# Patient Record
Sex: Female | Born: 1995 | Race: Black or African American | Hispanic: No | Marital: Single
Health system: Southern US, Community
[De-identification: ages and names within clinical notes are randomized; demographics above are authoritative.]

---
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
## 2017-12-18 ENCOUNTER — Emergency Department (HOSPITAL_COMMUNITY): Admission: EM | Admit: 2017-12-18 | Discharge: 2017-12-18 | Discharge status: Against Medical Advice | Payer: Self-pay

## 2017-12-18 NOTE — ED Notes (Signed)
Pt called for triage no answer  °

## 2018-03-27 ENCOUNTER — Encounter (HOSPITAL_COMMUNITY): Payer: Self-pay | Admitting: *Deleted

## 2018-03-27 ENCOUNTER — Emergency Department (HOSPITAL_COMMUNITY): Payer: Self-pay

## 2018-03-27 ENCOUNTER — Other Ambulatory Visit: Payer: Self-pay

## 2018-03-27 ENCOUNTER — Emergency Department (HOSPITAL_COMMUNITY)
Admission: EM | Admit: 2018-03-27 | Discharge: 2018-03-27 | Disposition: A | Discharge status: Home / Self Care | Payer: Self-pay | Attending: Emergency Medicine | Admitting: Emergency Medicine

## 2018-03-27 DIAGNOSIS — Z79899 Other long term (current) drug therapy: Secondary | ICD-10-CM | POA: Insufficient documentation

## 2018-03-27 DIAGNOSIS — Z7722 Contact with and (suspected) exposure to environmental tobacco smoke (acute) (chronic): Secondary | ICD-10-CM | POA: Insufficient documentation

## 2018-03-27 DIAGNOSIS — J45909 Unspecified asthma, uncomplicated: Secondary | ICD-10-CM | POA: Insufficient documentation

## 2018-03-27 DIAGNOSIS — J4540 Moderate persistent asthma, uncomplicated: Secondary | ICD-10-CM | POA: Insufficient documentation

## 2018-03-27 DIAGNOSIS — J069 Acute upper respiratory infection, unspecified: Secondary | ICD-10-CM | POA: Insufficient documentation

## 2018-03-27 MED ORDER — ALBUTEROL SULFATE HFA 108 (90 BASE) MCG/ACT IN AERS: 2.0000 | INHALATION_SPRAY | RESPIRATORY_TRACT | Status: DC

## 2018-03-27 MED ORDER — IPRATROPIUM-ALBUTEROL 0.5-2.5 (3) MG/3ML IN SOLN
3.0000 mL | Freq: Once | RESPIRATORY_TRACT | Status: AC
  Administered 2018-03-27: 3 mL via RESPIRATORY_TRACT
  Filled 2018-03-27: qty 3

## 2018-03-27 MED ORDER — PREDNISONE 20 MG PO TABS: 60.0000 mg | ORAL_TABLET | Freq: Once | ORAL | Status: DC

## 2018-03-27 MED ORDER — ACETAMINOPHEN 325 MG PO TABS
650.0000 mg | ORAL_TABLET | Freq: Four times a day (QID) | ORAL | Status: DC | PRN
  Administered 2018-03-27: 650 mg via ORAL
  Filled 2018-03-27: qty 2

## 2018-03-27 MED ORDER — ALBUTEROL SULFATE HFA 108 (90 BASE) MCG/ACT IN AERS: 2.0000 | INHALATION_SPRAY | RESPIRATORY_TRACT | 0 refills | Status: DC | PRN

## 2018-03-27 MED ORDER — ONDANSETRON 4 MG PO TBDP
4.0000 mg | ORAL_TABLET | Freq: Once | ORAL | Status: AC
  Administered 2018-03-27: 4 mg via ORAL
  Filled 2018-03-27: qty 1

## 2018-03-27 MED ORDER — PREDNISONE 10 MG PO TABS: ORAL_TABLET | ORAL | 0 refills | Status: DC

## 2018-03-27 NOTE — Discharge Instructions (Signed)
Return if any problems.

## 2018-03-27 NOTE — ED Provider Notes (Signed)
[facility] EMERGENCY DEPARTMENT Provider Note   CSN: [id number] Arrival date & time: 03/27/18  0210     History   Chief Complaint Chief Complaint  Patient presents with  . Generalized Body Aches    HPI Hannah Meyer is a 22 y.o. female.  The history is provided by the patient. No language interpreter was used.  Cough  This is a new problem. The problem occurs constantly. The problem has been gradually worsening. The cough is productive of sputum. There has been no fever. Associated symptoms include shortness of breath and wheezing. Pertinent negatives include no chest pain. She has tried nothing for the symptoms. The treatment provided no relief. She is not a smoker. Her past medical history is significant for asthma. Her past medical history does not include pneumonia.    Past Medical History:  Diagnosis Date  . Asthma     There are no active problems to display for this patient.   History reviewed. No pertinent surgical history.   OB History    Gravida  0   Para  0   Term  0   Preterm  0   AB  0   Living  0     SAB  0   TAB  0   Ectopic  0   Multiple  0   Live Births               Home Medications    Prior to Admission medications   Medication Sig Start Date End Date Taking? Authorizing Provider  acetaminophen (TYLENOL) 325 MG tablet Take 2 tablets (650 mg total) by mouth every 6 (six) hours as needed for fever. 09/21/15   Renne Crigler, PA-C  predniSONE (DELTASONE) 20 MG tablet Take 3 tablets (60 mg total) by mouth daily. 06/02/16   Niel Hummer, MD  predniSONE (DELTASONE) 20 MG tablet 3 tabs po daily x 4 days 06/02/16   Niel Hummer, MD    Family History No family history on file.  Social History Social History   Tobacco Use  . Smoking status: Passive Smoke Exposure - Never Smoker  . Smokeless tobacco: Never Used  Substance Use Topics  . Alcohol use: No  . Drug use: No     Allergies   Patient has no  known allergies.   Review of Systems Review of Systems  Respiratory: Positive for cough, shortness of breath and wheezing.   Cardiovascular: Negative for chest pain.  All other systems reviewed and are negative.    Physical Exam Updated Vital Signs BP 131/80   Pulse 66   Temp 98 F (36.7 C)   Resp 18   Ht 5\' 5"  (1.651 m)   Wt 99.8 kg (220 lb)   LMP 03/27/2018   SpO2 97%   BMI 36.61 kg/m   Physical Exam  Constitutional: She is oriented to person, place, and time. She appears well-developed and well-nourished.  HENT:  Head: Normocephalic.  Mouth/Throat: Oropharynx is clear and moist.  Eyes: EOM are normal.  Neck: Normal range of motion.  Cardiovascular: Normal rate and regular rhythm.  Pulmonary/Chest: Effort normal. She has wheezes.  Abdominal: She exhibits no distension.  Musculoskeletal: Normal range of motion.  Neurological: She is alert and oriented to person, place, and time.  Psychiatric: She has a normal mood and affect.  Nursing note and vitals reviewed.    ED Treatments / Results  Labs (all labs ordered are listed, but only abnormal results are displayed) Labs Reviewed -  No data to display  EKG None  Radiology No results found.  Procedures Procedures (including critical care time)  Medications Ordered in ED Medications  acetaminophen (TYLENOL) tablet 650 mg (650 mg Oral Given 03/27/18 0855)  ipratropium-albuterol (DUONEB) 0.5-2.5 (3) MG/3ML nebulizer solution 3 mL (3 mLs Nebulization Given 03/27/18 0855)  ondansetron (ZOFRAN-ODT) disintegrating tablet 4 mg (4 mg Oral Given 03/27/18 0855)     Initial Impression / Assessment and Plan / ED Course  I have reviewed the triage vital signs and the nursing notes.  Pertinent labs & imaging results that were available during my care of the patient were reviewed by me and considered in my medical decision making (see chart for details).    MDM Pt given duoneb, tylenol and zofran for headache and  nausea.   Pt given albuterol inhaler and Rx for prednisone   Final Clinical Impressions(s) / ED Diagnoses   Final diagnoses:  Upper respiratory tract infection, unspecified type  Moderate persistent asthma without complication    ED Discharge Orders        Ordered    predniSONE (DELTASONE) 10 MG tablet     03/27/18 0933    albuterol (PROVENTIL HFA;VENTOLIN HFA) 108 (90 Base) MCG/ACT inhaler  Every 4 hours PRN     03/27/18 0933       Elson AreasSofia, Linley Moskal K, PA-C 03/27/18 [id number]    Shaune PollackIsaacs, Cameron, MD 03/27/18 [phone]

## 2018-03-27 NOTE — ED Triage Notes (Signed)
Cold cough body aches with chills productive cough since Monday  lmp now

## 2018-05-27 ENCOUNTER — Emergency Department (HOSPITAL_COMMUNITY)
Admission: EM | Admit: 2018-05-27 | Discharge: 2018-05-27 | Disposition: A | Discharge status: Home / Self Care | Payer: No Typology Code available for payment source | Attending: Emergency Medicine | Admitting: Emergency Medicine

## 2018-05-27 ENCOUNTER — Encounter (HOSPITAL_COMMUNITY): Payer: Self-pay | Admitting: *Deleted

## 2018-05-27 ENCOUNTER — Emergency Department (HOSPITAL_COMMUNITY): Payer: No Typology Code available for payment source

## 2018-05-27 DIAGNOSIS — Y999 Unspecified external cause status: Secondary | ICD-10-CM | POA: Diagnosis not present

## 2018-05-27 DIAGNOSIS — Y9389 Activity, other specified: Secondary | ICD-10-CM | POA: Diagnosis not present

## 2018-05-27 DIAGNOSIS — Z7722 Contact with and (suspected) exposure to environmental tobacco smoke (acute) (chronic): Secondary | ICD-10-CM | POA: Insufficient documentation

## 2018-05-27 DIAGNOSIS — S0990XA Unspecified injury of head, initial encounter: Secondary | ICD-10-CM | POA: Diagnosis present

## 2018-05-27 DIAGNOSIS — Y929 Unspecified place or not applicable: Secondary | ICD-10-CM | POA: Insufficient documentation

## 2018-05-27 DIAGNOSIS — S0083XA Contusion of other part of head, initial encounter: Secondary | ICD-10-CM | POA: Diagnosis not present

## 2018-05-27 DIAGNOSIS — J45909 Unspecified asthma, uncomplicated: Secondary | ICD-10-CM | POA: Insufficient documentation

## 2018-05-27 NOTE — ED Notes (Signed)
Patient returned from CT

## 2018-05-27 NOTE — ED Notes (Signed)
Patient transported to CT 
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
## 2018-08-13 ENCOUNTER — Encounter (HOSPITAL_COMMUNITY): Payer: Self-pay | Admitting: *Deleted

## 2018-08-13 ENCOUNTER — Other Ambulatory Visit: Payer: Self-pay

## 2018-08-13 ENCOUNTER — Emergency Department (HOSPITAL_COMMUNITY)
Admission: EM | Admit: 2018-08-13 | Discharge: 2018-08-13 | Disposition: A | Discharge status: Home / Self Care | Payer: Self-pay | Attending: Emergency Medicine | Admitting: Emergency Medicine

## 2018-08-13 DIAGNOSIS — Z7722 Contact with and (suspected) exposure to environmental tobacco smoke (acute) (chronic): Secondary | ICD-10-CM | POA: Insufficient documentation

## 2018-08-13 DIAGNOSIS — J020 Streptococcal pharyngitis: Secondary | ICD-10-CM | POA: Insufficient documentation

## 2018-08-13 DIAGNOSIS — Z79899 Other long term (current) drug therapy: Secondary | ICD-10-CM | POA: Insufficient documentation

## 2018-08-13 DIAGNOSIS — J45909 Unspecified asthma, uncomplicated: Secondary | ICD-10-CM | POA: Insufficient documentation

## 2018-08-13 LAB — GROUP A STREP BY PCR: GROUP A STREP BY PCR: DETECTED — AB

## 2018-08-13 MED ORDER — LIDOCAINE VISCOUS HCL 2 % MT SOLN
15.0000 mL | Freq: Once | OROMUCOSAL | Status: AC
  Administered 2018-08-13: 15 mL via OROMUCOSAL
  Filled 2018-08-13: qty 15

## 2018-08-13 MED ORDER — DEXAMETHASONE SODIUM PHOSPHATE 10 MG/ML IJ SOLN
10.0000 mg | Freq: Once | INTRAMUSCULAR | Status: AC
  Administered 2018-08-13: 10 mg via INTRAMUSCULAR
  Filled 2018-08-13: qty 1

## 2018-08-13 MED ORDER — PENICILLIN G BENZATHINE & PROC 1200000 UNIT/2ML IM SUSP
1.2000 10*6.[IU] | Freq: Once | INTRAMUSCULAR | Status: AC
  Administered 2018-08-13: 1.2 10*6.[IU] via INTRAMUSCULAR
  Filled 2018-08-13: qty 2

## 2018-08-13 MED ORDER — KETOROLAC TROMETHAMINE 30 MG/ML IJ SOLN
30.0000 mg | Freq: Once | INTRAMUSCULAR | Status: AC
  Administered 2018-08-13: 30 mg via INTRAMUSCULAR
  Filled 2018-08-13: qty 1

## 2018-08-13 NOTE — Discharge Instructions (Signed)
You were treated today with antibiotics for strep throat, this should start to improve over the next 48 hours.  You may use ibuprofen and Tylenol every 6 hours for pain as well as Cepacol throat lozenges.  Make sure you are drinking plenty of fluids.  Return for worsening throat pain, fevers, difficulty swallowing or breathing or any other new or concerning symptoms.  

## 2018-08-13 NOTE — ED Notes (Signed)
Declined W/C at D/C and was escorted to lobby by RN. 

## 2018-08-13 NOTE — ED Provider Notes (Signed)
[facility] EMERGENCY DEPARTMENT Provider Note   CSN: [id number] Arrival date & time: 08/13/18  1050     History   Chief Complaint Chief Complaint  Patient presents with  . URI    HPI Hannah Meyer is a 22 y.o. female.  Hannah Meyer is a 22 y.o. Female with history of asthma, who presents to the emergency department for evaluation of sore throat which started 4 days ago.  Patient reports since onset patient has had a constant aching sore throat that has progressively worsened.  Pain is worse with swallowing patient has continued to eat and drink but reports is very painful.  Patient reports no associated nasal congestion, rhinorrhea or cough.  She does report some discomfort in bilateral ears without drainage she has been using some over-the-counter eardrops from [employer] without improvement.  No chest pain or shortness of breath, no abdominal pain, nausea or vomiting.  Patient reports an associated diffuse headache that she has had intermittently over the past few days it is gradual in onset.  No associated neck pain or stiffness.  No vision changes, no numbness or weakness.  Patient has been taking Tylenol with minimal improvement has not tried anything else for pain prior to arrival.  No known sick contacts.  No other aggravating or alleviating factors.     Past Medical History:  Diagnosis Date  . Asthma     There are no active problems to display for this patient.   History reviewed. No pertinent surgical history.   OB History    Gravida  0   Para  0   Term  0   Preterm  0   AB  0   Living  0     SAB  0   TAB  0   Ectopic  0   Multiple  0   Live Births               Home Medications    Prior to Admission medications   Medication Sig Start Date End Date Taking? Authorizing Provider  acetaminophen (TYLENOL) 325 MG tablet Take 2 tablets (650 mg total) by mouth every 6 (six) hours as needed for fever. 09/21/15    Renne Crigler, PA-C  albuterol (PROVENTIL HFA;VENTOLIN HFA) 108 (90 Base) MCG/ACT inhaler Inhale 2 puffs into the lungs every 4 (four) hours as needed for wheezing or shortness of breath. 03/27/18   Elson Areas, PA-C  predniSONE (DELTASONE) 10 MG tablet 6,5,4,3,2,1 03/27/18   Elson Areas, PA-C    Family History History reviewed. No pertinent family history.  Social History Social History   Tobacco Use  . Smoking status: Passive Smoke Exposure - Never Smoker  . Smokeless tobacco: Never Used  Substance Use Topics  . Alcohol use: No  . Drug use: No     Allergies   Patient has no known allergies.   Review of Systems Review of Systems  Constitutional: Negative for chills and fever.  HENT: Positive for ear pain, sore throat and trouble swallowing. Negative for dental problem, ear discharge, hearing loss, postnasal drip, rhinorrhea and sinus pain.   Eyes: Negative for visual disturbance.  Respiratory: Negative for cough and shortness of breath.   Cardiovascular: Negative for chest pain.  Gastrointestinal: Negative for abdominal pain, nausea and vomiting.  Musculoskeletal: Negative for arthralgias, myalgias, neck pain and neck stiffness.  Skin: Negative for color change and rash.  Neurological: Positive for headaches. Negative for dizziness, weakness, light-headedness and numbness.  Physical Exam Updated Vital Signs BP 123/78 (BP Location: Right Arm)   Pulse 90   Temp 98 F (36.7 C) (Oral)   Resp 18   Ht 5\' 5"  (1.651 m)   Wt 95.3 kg   LMP 07/30/2018   SpO2 100%   BMI 34.95 kg/m   Physical Exam  Constitutional: She is oriented to person, place, and time. She appears well-developed and well-nourished. No distress.  HENT:  Head: Normocephalic and atraumatic.  Mouth/Throat: Oropharyngeal exudate present.  TMs clear with good landmarks, minimal nasal mucosa edema with clear rhinorrhea, posterior oropharynx erythematous with 3+ tonsillar edema and tonsillar  exudates noted.  Uvula is midline, no trismus and normal phonation, tolerating secretions without difficulty.  No evidence of PTA.  Eyes: Right eye exhibits no discharge. Left eye exhibits no discharge.  Neck: Normal range of motion. Neck supple.  Mild cervical lymphadenopathy, no palpable area of fluctuance, range of motion intact, no stridor  Cardiovascular: Normal rate, regular rhythm, normal heart sounds and intact distal pulses.  Pulmonary/Chest: Effort normal and breath sounds normal. No respiratory distress.  Respirations equal and unlabored, patient able to speak in full sentences, lungs clear to auscultation bilaterally  Abdominal: Soft. Bowel sounds are normal. She exhibits no distension and no mass. There is no tenderness. There is no guarding.  Lymphadenopathy:    She has cervical adenopathy (mild).  Neurological: She is alert and oriented to person, place, and time. Coordination normal.  Speech is clear, able to follow commands CN III-XII intact Normal strength in upper and lower extremities bilaterally including dorsiflexion and plantar flexion, strong and equal grip strength Sensation normal to light and sharp touch Moves extremities without ataxia, coordination intact  Skin: Skin is warm and dry. She is not diaphoretic.  Psychiatric: She has a normal mood and affect. Her behavior is normal.  Nursing note and vitals reviewed.    ED Treatments / Results  Labs (all labs ordered are listed, but only abnormal results are displayed) Labs Reviewed  GROUP A STREP BY PCR - Abnormal; Notable for the following components:      Result Value   Group A Strep by PCR DETECTED (*)    All other components within normal limits    EKG None  Radiology No results found.  Procedures Procedures (including critical care time)  Medications Ordered in ED Medications  penicillin g procaine-penicillin g benzathine (BICILLIN-CR) injection 600000-600000 units (has no administration in  time range)  ketorolac (TORADOL) 30 MG/ML injection 30 mg (30 mg Intramuscular Given 08/13/18 1150)  dexamethasone (DECADRON) injection 10 mg (10 mg Intramuscular Given 08/13/18 1154)  lidocaine (XYLOCAINE) 2 % viscous mouth solution 15 mL (15 mLs Mouth/Throat Given 08/13/18 1154)     Initial Impression / Assessment and Plan / ED Course  I have reviewed the triage vital signs and the nursing notes.  Pertinent labs & imaging results that were available during my care of the patient were reviewed by me and considered in my medical decision making (see chart for details).  Pt febrile with tonsillar exudate, cervical lymphadenopathy, & dysphagia positive rapid strep test; diagnosis of bacterial pharyngitis.  Patient reports mild headache which I feel is likely associated with strep, improved with treatment here in the ED, normal neurologic exam.  No nuchal rigidity and patient is afebrile, low suspicion for meningitis.  Treated in the ED with steroids, NSAIDs, viscous lidocaine and PCN IM.  Pt appears mildly dehydrated, discussed importance of water rehydration. Presentation non concerning for PTA  or RPA. No trismus or uvula deviation. Specific return precautions discussed. Pt able to drink water in ED without difficulty with intact air way. Recommended PCP follow up.   Final Clinical Impressions(s) / ED Diagnoses   Final diagnoses:  Strep throat    ED Discharge Orders    None       Dartha Lodge, [place] 08/13/18 1241    Loren Racer, MD 08/14/18 1037

## 2018-08-13 NOTE — ED Triage Notes (Signed)
PT reports ear fullness ,watery eyes and sore throat since Sunday. Pt denies fever.

## 2019-05-01 ENCOUNTER — Encounter (HOSPITAL_COMMUNITY): Payer: Self-pay | Admitting: Emergency Medicine

## 2019-05-01 ENCOUNTER — Emergency Department (HOSPITAL_COMMUNITY): Payer: Self-pay

## 2019-05-01 ENCOUNTER — Other Ambulatory Visit: Payer: Self-pay

## 2019-05-01 ENCOUNTER — Emergency Department (HOSPITAL_COMMUNITY)
Admission: EM | Admit: 2019-05-01 | Discharge: 2019-05-01 | Disposition: A | Discharge status: Home / Self Care | Payer: Self-pay | Attending: Emergency Medicine | Admitting: Emergency Medicine

## 2019-05-01 DIAGNOSIS — M79672 Pain in left foot: Secondary | ICD-10-CM | POA: Insufficient documentation

## 2019-05-01 DIAGNOSIS — Z79899 Other long term (current) drug therapy: Secondary | ICD-10-CM | POA: Insufficient documentation

## 2019-05-01 DIAGNOSIS — J45909 Unspecified asthma, uncomplicated: Secondary | ICD-10-CM | POA: Insufficient documentation

## 2019-05-01 NOTE — ED Triage Notes (Signed)
Pt reports awaking with left foot swelling and pain. She denies any insect bites or injury, and says the pain affects her walking.

## 2019-05-01 NOTE — ED Provider Notes (Signed)
  Physical Exam  BP 130/90 (BP Location: Right Arm)   Pulse 78   Temp 98.3 F (36.8 C) (Oral)   Resp 17   Ht 5\' 4"  (1.626 m)   Wt 83.9 kg   SpO2 100%   BMI 31.76 kg/m   Assumed care from [place] Robinson, PA-C at 1600. Briefly, the patient is a 23 y.o. female with PMHx of  has a past medical history of Asthma. here with right foot pain.  Patient feels that the lateral aspect of her left foot is subjectively swollen.  Atraumatic.  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  No history of immune compromise status or diabetes.  Labs Reviewed - No data to display  Course of Care:    Physical Exam Vitals signs and nursing note reviewed.  Constitutional:      General: She is not in acute distress.    Appearance: She is well-developed. She is not diaphoretic.     Comments: Sitting comfortably in bed.  HENT:     Head: Normocephalic and atraumatic.  Eyes:     General:        Right eye: No discharge.        Left eye: No discharge.     Conjunctiva/sclera: Conjunctivae normal.     Comments: EOMs normal to gross examination.  Neck:     Musculoskeletal: Normal range of motion.  Cardiovascular:     Rate and Rhythm: Normal rate and regular rhythm.     Comments: Intact, 2+ left DP pulse. Abdominal:     General: There is no distension.  Musculoskeletal: Normal range of motion.     Comments: Left foot and ankle with no objective swelling.  No erythema of the left foot or ankle.  Patient has tenderness overlying the fourth metatarsal.  Patient retains full capacity of flexion, extension, inversion and eversion of the left foot.  Skin:    General: Skin is warm and dry.  Neurological:     Mental Status: She is alert.     Comments: Cranial nerves intact to gross observation. Patient moves extremities without difficulty.  Psychiatric:        Behavior: Behavior normal.        Thought Content: Thought content normal.      Judgment: Judgment normal.     ED Course/Procedures     Procedures  MDM   No swelling of the foot objectively.  Radiograph demonstrates no acute bony abnormality.  There does not appear to be any exam findings consistent with septic arthritis or soft tissue infection of the foot.  Suspect tendinitis.  Will have patient anti-inflammatories, rest, ice, and elevate.  She is given orthopedic follow-up if not improving.  Return precautions given for any increasing pain, pallor paresthesias swelling, erythema or new or worsening symptoms.       Elisha Ponder, PA-C 05/01/19 1700    Mancel Bale, MD 05/02/19 [phone]

## 2019-05-01 NOTE — Discharge Instructions (Addendum)
Please see the information and instructions below regarding your visit.  Your diagnoses today include:  1. Left foot pain    Tests performed today include: An x-ray of your ankle and foot - does NOT show any broken bones or other abnormalities  See side panel of your discharge paperwork for testing performed today. Vital signs are listed at the bottom of these instructions.   Medications prescribed:  Take any prescribed medications only as prescribed, and any over the counter medications only as directed on the packaging.  You are prescribed naproxen, a non-steroidal anti-inflammatory agent (NSAID) for pain. You may take 375mg  every 12 hours as needed for pain. If still requiring this medication around the clock for acute pain after 10 days, please see your primary healthcare provider.  Women who are pregnant, breastfeeding, or planning on becoming pregnant should not take non-steroidal anti-inflammatories such as Advil and Aleve. Tylenol is a safe over the counter pain reliever in pregnant women.  You may combine this medication with Tylenol, 650 mg every 6 hours, so you are receiving something for pain every 3 hours.  This is not a long-term medication unless under the care and direction of your primary provider. Taking this medication long-term and not under the supervision of a healthcare provider could increase the risk of stomach ulcers, kidney problems, and cardiovascular problems such as high blood pressure.    Home care instructions:  Follow R.I.C.E. Protocol: R - rest your injury  I  - use ice on injury without applying directly to skin C - compress injury with bandage or splint E - elevate the injury as much as possible  For Activity: Wear ACE wrap for at least one week.   Please follow any educational materials contained in this packet.   Follow-up instructions: Please follow-up with your primary care provider or the provided orthopedic (bone specialist) listed in this  packet if you continue to have significant pain or trouble walking in 1 week. In this case you may have a severe sprain that requires further care.   Return instructions:  Please return if your toes are numb or tingling, appear gray or blue, are much colder than your other foot, or you have severe pain (also elevate leg and loosen splint or wrap). Please return to the Emergency Department if you experience worsening symptoms.  Please return if you have any other emergent concerns.  Additional Information:   Your vital signs today were: BP 130/90 (BP Location: Right Arm)    Pulse 78    Temp 98.3 F (36.8 C) (Oral)    Resp 17    Ht 5\' 4"  (1.626 m)    Wt 83.9 kg    SpO2 100%    BMI 31.76 kg/m  If your blood pressure (BP) was elevated on multiple readings during this visit above 130 for the top number or above 80 for the bottom number, please have this repeated by your primary care provider within one month. --------------  Thank you for allowing [place] to participate in your care today.

## 2019-05-01 NOTE — ED Provider Notes (Signed)
[facility] Provider Note   CSN: [id number] Arrival date & time: 05/01/19  1439    History   Chief Complaint Chief Complaint  Patient presents with  . Foot Swelling    HPI Hannah Meyer is a 23 y.o. female presenting to the emergency department with left foot and ankle pain that she noticed upon awakening this morning.  She states she may have slept on it wrong.  She has pain to the dorsal and lateral aspect of the foot and lateral ankle that is worse with movement and palpation.  No previous injuries.  No history of gout or family hx of gout.  No fevers.  No medications tried prior to arrival.     The history is provided by the patient.    Past Medical History:  Diagnosis Date  . Asthma     There are no active problems to display for this patient.   History reviewed. No pertinent surgical history.   OB History    Gravida  0   Para  0   Term  0   Preterm  0   AB  0   Living  0     SAB  0   TAB  0   Ectopic  0   Multiple  0   Live Births               Home Medications    Prior to Admission medications   Medication Sig Start Date End Date Taking? Authorizing Provider  acetaminophen (TYLENOL) 325 MG tablet Take 2 tablets (650 mg total) by mouth every 6 (six) hours as needed for fever. 09/21/15   Renne CriglerGeiple, Joshua, PA-C  albuterol (PROVENTIL HFA;VENTOLIN HFA) 108 (90 Base) MCG/ACT inhaler Inhale 2 puffs into the lungs every 4 (four) hours as needed for wheezing or shortness of breath. 03/27/18   Elson AreasSofia, Leslie K, PA-C  predniSONE (DELTASONE) 10 MG tablet 6,5,4,3,2,1 03/27/18   Elson AreasSofia, Leslie K, PA-C    Family History No family history on file.  Social History Social History   Tobacco Use  . Smoking status: Passive Smoke Exposure - Never Smoker  . Smokeless tobacco: Never Used  Substance Use Topics  . Alcohol use: No  . Drug use: Yes    Types: Marijuana     Allergies   Patient has no known  allergies.   Review of Systems Review of Systems  Constitutional: Negative for fever.  Musculoskeletal: Positive for arthralgias and myalgias.  Skin: Negative for color change and wound.  Neurological: Negative for numbness.     Physical Exam Updated Vital Signs Pulse 94   Temp 98.3 F (36.8 C) (Oral)   Resp 18   Ht 5\' 4"  (1.626 m)   Wt 83.9 kg   SpO2 100%   BMI 31.76 kg/m   Physical Exam Vitals signs and nursing note reviewed.  Constitutional:      General: She is not in acute distress.    Appearance: She is well-developed.  HENT:     Head: Normocephalic and atraumatic.  Eyes:     Conjunctiva/sclera: Conjunctivae normal.  Cardiovascular:     Rate and Rhythm: Normal rate.  Pulmonary:     Effort: Pulmonary effort is normal.  Musculoskeletal:     Comments: Left lateral malleolus and dorsal lateral aspect of the foot with tenderness.  No obvious deformity, swelling, or erythema.  No obvious warmth.  Neurovascularly intact.  Pain with any range of motion of the ankle or  foot.  No calf tenderness or swelling.  Neurological:     Mental Status: She is alert.  Psychiatric:        Mood and Affect: Mood normal.        Behavior: Behavior normal.      ED Treatments / Results  Labs (all labs ordered are listed, but only abnormal results are displayed) Labs Reviewed - No data to display  EKG None  Radiology No results found.  Procedures Procedures (including critical care time)  Medications Ordered in ED Medications - No data to display   Initial Impression / Assessment and Plan / ED Course  I have reviewed the triage vital signs and the nursing notes.  Pertinent labs & imaging results that were available during my care of the patient were reviewed by me and considered in my medical decision making (see chart for details).        Patient presenting with pain to her left foot and ankle this morning, thinks she may have slept on it wrong.  No recent known  injury or previous injury.  No personal or family history of gout.  On exam today, I cannot appreciate any significant swelling.  No redness.  Tenderness and pain with range of motion is present.  No deformity.  Neurovascularly intact.  Ibuprofen given for pain.  Exam is not consistent with gouty or septic arthritis.  Suspect possible strain.  X-rays pending.  Care assumed by PA [facility].  Plan to follow-up x-ray results.  If negative, treat symptomatically with RICE therapy, NSAIDs, PCP follow-up.  Final Clinical Impressions(s) / ED Diagnoses   Final diagnoses:  None    ED Discharge Orders    None       Robinson, [place] N, PA-C 05/01/19 1552    Mancel Bale, MD 05/02/19 [phone]

## 2020-10-22 ENCOUNTER — Emergency Department (HOSPITAL_COMMUNITY): Payer: Self-pay

## 2020-10-22 ENCOUNTER — Encounter (HOSPITAL_COMMUNITY): Payer: Self-pay

## 2020-10-22 ENCOUNTER — Other Ambulatory Visit: Payer: Self-pay

## 2020-10-22 ENCOUNTER — Emergency Department (HOSPITAL_COMMUNITY)
Admission: EM | Admit: 2020-10-22 | Discharge: 2020-10-22 | Disposition: A | Discharge status: Home / Self Care | Payer: Self-pay | Attending: Emergency Medicine | Admitting: Emergency Medicine

## 2020-10-22 DIAGNOSIS — F17200 Nicotine dependence, unspecified, uncomplicated: Secondary | ICD-10-CM | POA: Insufficient documentation

## 2020-10-22 DIAGNOSIS — R064 Hyperventilation: Secondary | ICD-10-CM | POA: Insufficient documentation

## 2020-10-22 DIAGNOSIS — F419 Anxiety disorder, unspecified: Secondary | ICD-10-CM | POA: Insufficient documentation

## 2020-10-22 DIAGNOSIS — R202 Paresthesia of skin: Secondary | ICD-10-CM | POA: Insufficient documentation

## 2020-10-22 DIAGNOSIS — Z5321 Procedure and treatment not carried out due to patient leaving prior to being seen by health care provider: Secondary | ICD-10-CM | POA: Insufficient documentation

## 2020-10-22 DIAGNOSIS — J45909 Unspecified asthma, uncomplicated: Secondary | ICD-10-CM | POA: Insufficient documentation

## 2020-10-22 LAB — COMPREHENSIVE METABOLIC PANEL
ALT: 19 U/L (ref 0–44)
AST: 26 U/L (ref 15–41)
Albumin: 3.9 g/dL (ref 3.5–5.0)
Alkaline Phosphatase: 70 U/L (ref 38–126)
Anion gap: 12 (ref 5–15)
BUN: <5 mg/dL — ABNORMAL LOW (ref 6–20)
CO2: 20 mmol/L — ABNORMAL LOW (ref 22–32)
Calcium: 8.6 mg/dL — ABNORMAL LOW (ref 8.9–10.3)
Chloride: 110 mmol/L (ref 98–111)
Creatinine, Ser: 0.76 mg/dL (ref 0.44–1.00)
GFR, Estimated: >60 mL/min (ref >60)
Glucose, Bld: 87 mg/dL (ref 70–99)
Potassium: 3.5 mmol/L (ref 3.5–5.1)
Sodium: 142 mmol/L (ref 135–145)
Total Bilirubin: 0.4 mg/dL (ref 0.3–1.2)
Total Protein: 7.4 g/dL (ref 6.5–8.1)

## 2020-10-22 LAB — I-STAT BETA HCG BLOOD, ED (MC, WL, AP ONLY): I-stat hCG, quantitative: <5 m[IU]/mL (ref <5)

## 2020-10-22 LAB — CBC
HCT: 38.4 % (ref 36.0–46.0)
Hemoglobin: 12.5 g/dL (ref 12.0–15.0)
MCH: 27.4 pg (ref 26.0–34.0)
MCHC: 32.6 g/dL (ref 30.0–36.0)
MCV: 84 fL (ref 80.0–100.0)
Platelets: 422 10*3/uL — ABNORMAL HIGH (ref 150–400)
RBC: 4.57 MIL/uL (ref 3.87–5.11)
RDW: 16.4 % — ABNORMAL HIGH (ref 11.5–15.5)
WBC: 9.9 10*3/uL (ref 4.0–10.5)
nRBC: 0 % (ref 0.0–0.2)

## 2020-10-22 LAB — ETHANOL: Alcohol, Ethyl (B): 255 mg/dL — ABNORMAL HIGH (ref <10)

## 2020-10-22 NOTE — ED Triage Notes (Addendum)
Patient arrived to ED hyperventilating and bilateral hand tingling. Patient states increased anxiety today that got her upset. Also reports asthma attack earlier and got re;ief with inhaler. occasional  Smoker. Patient tearful and states she has rage, denies suicidal thoughts

## 2020-10-22 NOTE — ED Notes (Signed)
Pt's sister came in and states pt is in car leaving.  Encouraged for her to stay.  Sister requesting work note for herself because she left work to come get pt.  Explained that we cannot give sister a note and can only give pt a note if she stays to see Dr.

## 2020-11-29 ENCOUNTER — Other Ambulatory Visit: Payer: Self-pay

## 2020-11-29 ENCOUNTER — Emergency Department (HOSPITAL_COMMUNITY)
Admission: EM | Admit: 2020-11-29 | Discharge: 2020-11-29 | Disposition: A | Discharge status: Home / Self Care | Payer: Self-pay | Attending: Emergency Medicine | Admitting: Emergency Medicine

## 2020-11-29 ENCOUNTER — Emergency Department (HOSPITAL_COMMUNITY): Payer: Self-pay

## 2020-11-29 ENCOUNTER — Encounter (HOSPITAL_COMMUNITY): Payer: Self-pay | Admitting: Emergency Medicine

## 2020-11-29 DIAGNOSIS — Z7722 Contact with and (suspected) exposure to environmental tobacco smoke (acute) (chronic): Secondary | ICD-10-CM | POA: Insufficient documentation

## 2020-11-29 DIAGNOSIS — Z7951 Long term (current) use of inhaled steroids: Secondary | ICD-10-CM | POA: Insufficient documentation

## 2020-11-29 DIAGNOSIS — J45909 Unspecified asthma, uncomplicated: Secondary | ICD-10-CM | POA: Insufficient documentation

## 2020-11-29 DIAGNOSIS — S6991XA Unspecified injury of right wrist, hand and finger(s), initial encounter: Secondary | ICD-10-CM | POA: Insufficient documentation

## 2020-11-29 DIAGNOSIS — X58XXXA Exposure to other specified factors, initial encounter: Secondary | ICD-10-CM | POA: Insufficient documentation

## 2020-11-29 MED ORDER — NAPROXEN 375 MG PO TABS: 375.0000 mg | ORAL_TABLET | Freq: Two times a day (BID) | ORAL | 0 refills | Status: DC

## 2020-11-29 NOTE — ED Notes (Signed)
Pt back from X-ray.  

## 2020-11-29 NOTE — ED Notes (Signed)
Reviewed discharge instructions with patient. Follow-up care and medications reviewed. Patient  verbalized understanding. Patient A&Ox4, VSS, and ambulatory with steady gait upon discharge.  °

## 2020-11-29 NOTE — ED Notes (Signed)
Pt transported to Xray. 

## 2020-11-29 NOTE — ED Provider Notes (Signed)
[facility] EMERGENCY DEPARTMENT Provider Note   CSN: [id number] Arrival date & time: 11/29/20  0258     History Chief Complaint  Patient presents with  . Finger Injury    Hannah Meyer is a 24 y.o. female.  HPI   Patient presents to the ED with complaints of right pinky finger pain.  Patient states she thinks she might of slammed in a car door on Sunday.  She has been trying to ice it but continues to have pain and discomfort.  She denies any fevers.  No complaints of pain or injury elsewhere.  The pain is at the base of her fifth finger and does increase with movement and palpation.  Past Medical History:  Diagnosis Date  . Asthma     There are no problems to display for this patient.   History reviewed. No pertinent surgical history.   OB History    Gravida  0   Para  0   Term  0   Preterm  0   AB  0   Living  0     SAB  0   IAB  0   Ectopic  0   Multiple  0   Live Births              No family history on file.  Social History   Tobacco Use  . Smoking status: Passive Smoke Exposure - Never Smoker  . Smokeless tobacco: Never Used  Substance Use Topics  . Alcohol use: No  . Drug use: Yes    Types: Marijuana    Home Medications Prior to Admission medications   Medication Sig Start Date End Date Taking? Authorizing Provider  acetaminophen (TYLENOL) 325 MG tablet Take 2 tablets (650 mg total) by mouth every 6 (six) hours as needed for fever. 09/21/15   Renne Crigler, PA-C  albuterol (PROVENTIL HFA;VENTOLIN HFA) 108 (90 Base) MCG/ACT inhaler Inhale 2 puffs into the lungs every 4 (four) hours as needed for wheezing or shortness of breath. 03/27/18   Elson Areas, PA-C  naproxen (NAPROSYN) 375 MG tablet Take 1 tablet (375 mg total) by mouth 2 (two) times daily. 11/29/20   Linwood Dibbles, MD  predniSONE (DELTASONE) 10 MG tablet 5,2,7,7,8,2 03/27/18   Elson Areas, PA-C    Allergies    Patient has no known  allergies.  Review of Systems   Review of Systems  All other systems reviewed and are negative.   Physical Exam Updated Vital Signs BP (!) 141/99 (BP Location: Left Arm)   Pulse 82   Temp 98.1 F (36.7 C) (Oral)   Resp 14   LMP 11/23/2020   SpO2 100%   Physical Exam Vitals and nursing note reviewed.  Constitutional:      General: She is not in acute distress.    Appearance: She is well-developed.  HENT:     Head: Normocephalic and atraumatic.     Right Ear: External ear normal.     Left Ear: External ear normal.  Eyes:     General: No scleral icterus.       Right eye: No discharge.        Left eye: No discharge.     Conjunctiva/sclera: Conjunctivae normal.  Neck:     Trachea: No tracheal deviation.  Cardiovascular:     Rate and Rhythm: Normal rate.  Pulmonary:     Effort: Pulmonary effort is normal. No respiratory distress.     Breath sounds: No  stridor.  Abdominal:     General: There is no distension.  Musculoskeletal:        General: Swelling and tenderness present. No deformity.     Cervical back: Neck supple.     Comments: Tenderness palpation over proximal phalanx right finger with mild swelling noted, no laceration or contusion noted, no erythema or edema, full range of motion, distal neurovascular intact  Skin:    General: Skin is warm and dry.     Findings: No rash.  Neurological:     Mental Status: She is alert.     Cranial Nerves: Cranial nerve deficit: no gross deficits.     ED Results / Procedures / Treatments   Labs (all labs ordered are listed, but only abnormal results are displayed) Labs Reviewed - No data to display  EKG None  Radiology DG Finger Little Right  Result Date: 11/29/2020 CLINICAL DATA:  Pain and injury EXAM: RIGHT LITTLE FINGER 2+V COMPARISON:  None. FINDINGS: There is no evidence of fracture or dislocation. There is no evidence of arthropathy or other focal bone abnormality. Soft tissue swelling seen diffusely around the  fifth digit. IMPRESSION: No acute osseous abnormality. Electronically Signed   By: Jonna Clark M.D.   On: 11/29/2020 11:35    Procedures Procedures (including critical care time)  Medications Ordered in ED Medications - No data to display  ED Course  I have reviewed the triage vital signs and the nursing notes.  Pertinent labs & imaging results that were available during my care of the patient were reviewed by me and considered in my medical decision making (see chart for details).    MDM Rules/Calculators/A&P                         X-ray without signs of fracture or dislocation.  Symptoms are consistent with a contusion.  No signs to suggest infection.  We will buddy tape.  Rx for NSAIDs.  Symptoms should resolve over the next week Final Clinical Impression(s) / ED Diagnoses Final diagnoses:  Injury of finger of right hand, initial encounter    Rx / DC Orders ED Discharge Orders         Ordered    naproxen (NAPROSYN) 375 MG tablet  2 times daily        11/29/20 1145           Linwood Dibbles, MD 11/29/20 1146

## 2020-11-29 NOTE — Discharge Instructions (Signed)
Take the medication as needed for pain.  Buddy tape your fingers to help with the pain and discomfort.  The symptoms should resolve over the next week or so.  Consider seeing an orthopedic doctor if the symptoms persist beyond that time

## 2020-11-29 NOTE — ED Triage Notes (Signed)
Pt reports she hurt her finger Sunday morning, unsure of what she did, continued pain to R 5th digit.

## 2022-01-06 ENCOUNTER — Emergency Department (HOSPITAL_COMMUNITY): Payer: Self-pay

## 2022-01-06 ENCOUNTER — Encounter (HOSPITAL_COMMUNITY): Payer: Self-pay

## 2022-01-06 ENCOUNTER — Emergency Department (HOSPITAL_COMMUNITY)
Admission: EM | Admit: 2022-01-06 | Discharge: 2022-01-06 | Disposition: A | Discharge status: Home / Self Care | Payer: Self-pay | Attending: Emergency Medicine | Admitting: Emergency Medicine

## 2022-01-06 ENCOUNTER — Other Ambulatory Visit: Payer: Self-pay

## 2022-01-06 DIAGNOSIS — R531 Weakness: Secondary | ICD-10-CM | POA: Insufficient documentation

## 2022-01-06 DIAGNOSIS — Z79899 Other long term (current) drug therapy: Secondary | ICD-10-CM | POA: Insufficient documentation

## 2022-01-06 DIAGNOSIS — M25561 Pain in right knee: Secondary | ICD-10-CM | POA: Insufficient documentation

## 2022-01-06 MED ORDER — NAPROXEN 375 MG PO TABS: 375.0000 mg | ORAL_TABLET | Freq: Two times a day (BID) | ORAL | 0 refills | Status: DC

## 2022-01-06 MED ORDER — IBUPROFEN 200 MG PO TABS
600.0000 mg | ORAL_TABLET | Freq: Once | ORAL | Status: AC
  Administered 2022-01-06: 600 mg via ORAL
  Filled 2022-01-06: qty 1

## 2022-01-06 NOTE — Discharge Instructions (Addendum)
Your x-ray did not show any fracture.  You likely have a sprain of the right knee.  I have sent in naproxen to the pharmacy for you.  Do not take this along with ibuprofen.  If you cannot pick up naproxen you can alternatively take ibuprofen 600 mg 3 times a day.  I recommend icing the knee every 4 hours for 15 to 20 minutes.  This typically takes couple weeks to fully resolve.  If you do not have improvement in your symptoms I did attach orthopedic information for you to follow-up.  You can give them a call to schedule an appointment if your symptoms or not improving.  If you have worsening symptoms he can return to the emergency room for evaluation.

## 2022-01-06 NOTE — ED Triage Notes (Signed)
Pt reports approx 1hr ago, she was walking & R leg "just gave out," fell to knees. Unsure if she pulled muscle or just landed wrong. Pain from mid-thigh to mid-calf. No obvious deformity noted, pt states unable to bear weight due to fear of falling

## 2022-01-06 NOTE — ED Notes (Signed)
Patient transported to X-ray 

## 2022-01-06 NOTE — ED Provider Notes (Signed)
[facility] EMERGENCY DEPARTMENT Provider Note   CSN: [id number] Arrival date & time: 01/06/22  1935     History  Chief Complaint  Patient presents with   Leg Pain    R    Hannah Meyer is a 26 y.o. female.  26 year old female presents today for evaluation of right lower extremity weakness and difficulty bearing weight onset just prior to arrival.  Patient reports she was horse playing with her friends all of a sudden her right knee gave out and she fell.  She states on subsequent attempts of bearing weight she continued to fall.  She denies fever, chills.  She denies knee pain prior to this episode earlier today.  Patient does report drinking alcohol prior to this episode and she does appear intoxicated on interview.  She states she has had 2 cans of beer and a shot.  She denies swelling over the joint.  The history is provided by the patient. No language interpreter was used.      Home Medications Prior to Admission medications   Medication Sig Start Date End Date Taking? Authorizing Provider  acetaminophen (TYLENOL) 325 MG tablet Take 2 tablets (650 mg total) by mouth every 6 (six) hours as needed for fever. 09/21/15   Carlisle Cater, PA-C  albuterol (PROVENTIL HFA;VENTOLIN HFA) 108 (90 Base) MCG/ACT inhaler Inhale 2 puffs into the lungs every 4 (four) hours as needed for wheezing or shortness of breath. 03/27/18   Fransico Meadow, PA-C  naproxen (NAPROSYN) 375 MG tablet Take 1 tablet (375 mg total) by mouth 2 (two) times daily. 11/29/20   Dorie Rank, MD  predniSONE (DELTASONE) 10 MG tablet [id number] 03/27/18   Fransico Meadow, PA-C      Allergies    Patient has no known allergies.    Review of Systems   Review of Systems  Constitutional:  Negative for chills and fever.  Musculoskeletal:  Positive for arthralgias and gait problem. Negative for joint swelling.  Skin:  Negative for wound.  All other systems reviewed and are negative.  Physical  Exam Updated Vital Signs BP 122/86    Pulse 79    Temp 97.7 F (36.5 C) (Oral)    Resp 19    SpO2 99%  Physical Exam Vitals and nursing note reviewed.  Constitutional:      General: She is not in acute distress.    Appearance: Normal appearance. She is not ill-appearing.  HENT:     Head: Normocephalic and atraumatic.     Nose: Nose normal.  Eyes:     Conjunctiva/sclera: Conjunctivae normal.  Cardiovascular:     Rate and Rhythm: Normal rate and regular rhythm.  Pulmonary:     Effort: Pulmonary effort is normal. No respiratory distress.  Musculoskeletal:        General: No deformity. Normal range of motion.     Cervical back: Normal range of motion.     Comments: Patient without visible deformity or swelling of the right knee joint.  Full range of motion present in right hip, right knee, right ankle.  5/5 strength in bilateral ankles, bilateral knees, bilateral hips.  Sensation intact.  2+ DP pulses and symmetrical.  Anterior drawer, posterior drawer, valgus, varus without laxity.  Right knee without warmth or erythema.  Skin:    Findings: No rash.  Neurological:     Mental Status: She is alert.    ED Results / Procedures / Treatments   Labs (all labs ordered are listed, but  only abnormal results are displayed) Labs Reviewed  POC URINE PREG, ED    EKG None  Radiology No results found.  Procedures Procedures    Medications Ordered in ED Medications - No data to display  ED Course/ Medical Decision Making/ A&P                           Medical Decision Making Amount and/or Complexity of Data Reviewed Radiology: ordered.   Medical Decision Making / ED Course   This patient presents to the ED for concern of right knee pain and difficulty to bear weight, this involves an extensive number of treatment options, and is a complaint that carries with it a high risk of complications and morbidity.  The differential diagnosis includes septic joint, MSK pain, ligament  sprain, acute fracture  MDM: 26 year old female presents today for evaluation of right lower extremity weakness and difficulty bearing weight onset just prior to arrival.  Patient reports she was horse playing with her friends when her knee gave out and she fell onto her right side.  She denies head trauma, loss of consciousness.  Patient reports on subsequent attempts to stand up patient was unable to bear weight.  Patient reports she has been drinking alcohol and does appear intoxicated on exam.  Denies other illicit drug use.  Right knee x-ray negative for acute fracture or findings.  Exam reassuring and with 5/5 strength and neurovascularly intact.  Without tenderness to palpation present in the on the right lower extremity.  I suspect her symptoms are related to her intoxication.  Lab Tests: -I ordered, reviewed, and interpreted labs.   The pertinent results include:   Labs Reviewed  POC URINE PREG, ED      EKG  EKG Interpretation  Date/Time:    Ventricular Rate:    PR Interval:    QRS Duration:   QT Interval:    QTC Calculation:   R Axis:     Text Interpretation:           Imaging Studies ordered: I ordered imaging studies including right knee x-ray I independently visualized and interpreted imaging. I agree with the radiologist interpretation   Medicines ordered and prescription drug management: No orders of the defined types were placed in this encounter.   -I have reviewed the patients home medicines and have made adjustments as needed  Reevaluation: After the interventions noted above, I reevaluated the patient and found that they have :improved Patient able ambulate with me in the room.  Did report mild right knee pain.  Co morbidities that complicate the patient evaluation  Past Medical History:  Diagnosis Date   Asthma       Dispostion: Patient appropriate for discharge.  Discharged in stable condition.  Emergency room course and plan discussed with  her boyfriend Kareem with patient's permission.  He will pick her up and drive her home given she is intoxicated.  Final Clinical Impression(s) / ED Diagnoses Final diagnoses:  Acute pain of right knee    Rx / DC Orders ED Discharge Orders          Ordered    naproxen (NAPROSYN) 375 MG tablet  2 times daily        01/06/22 2136              Evlyn Courier, PA-C 01/06/22 2139    Drenda Freeze, MD 01/06/22 [phone]

## 2022-05-20 IMAGING — DX DG KNEE COMPLETE 4+V*R*
4 series · 4 of 4 positions shown · non-contrast
Comparison: None.

CLINICAL DATA: Right knee pain

EXAM:
RIGHT KNEE - COMPLETE 4+ VIEW

[knee ap]
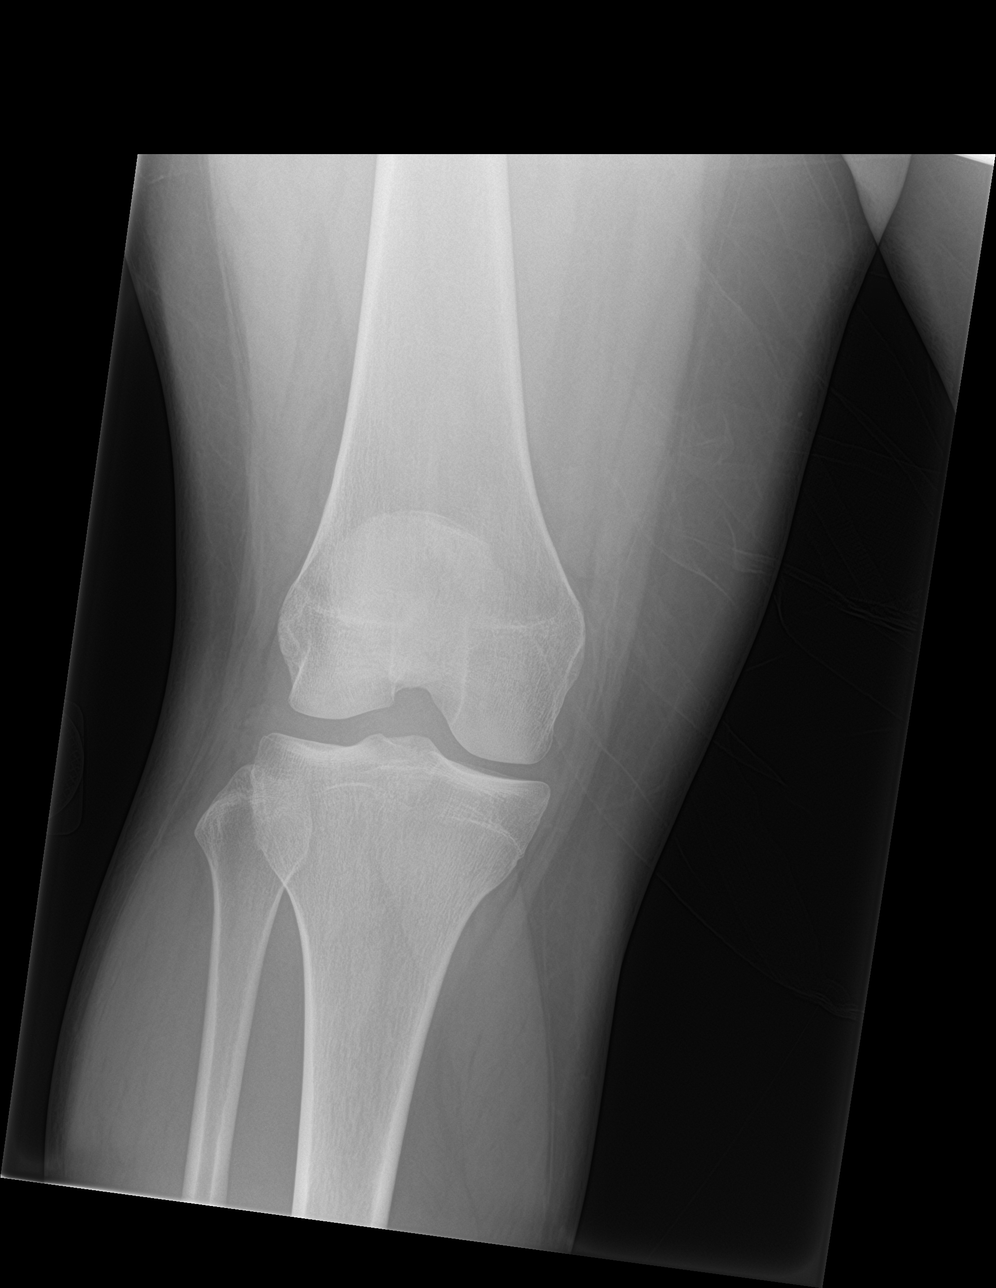

[knee lat]
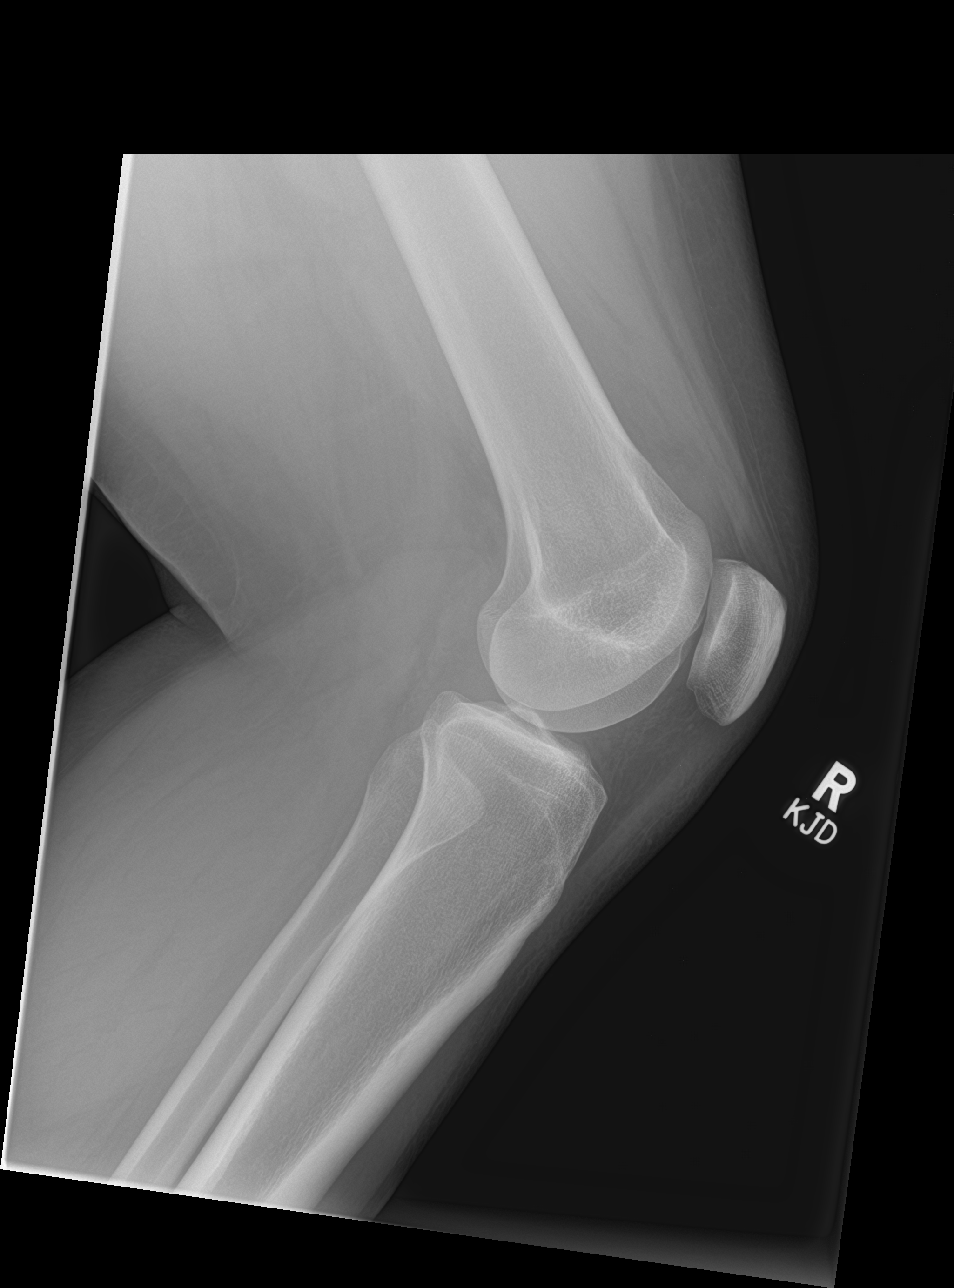

[knee obl (1 of 2)]
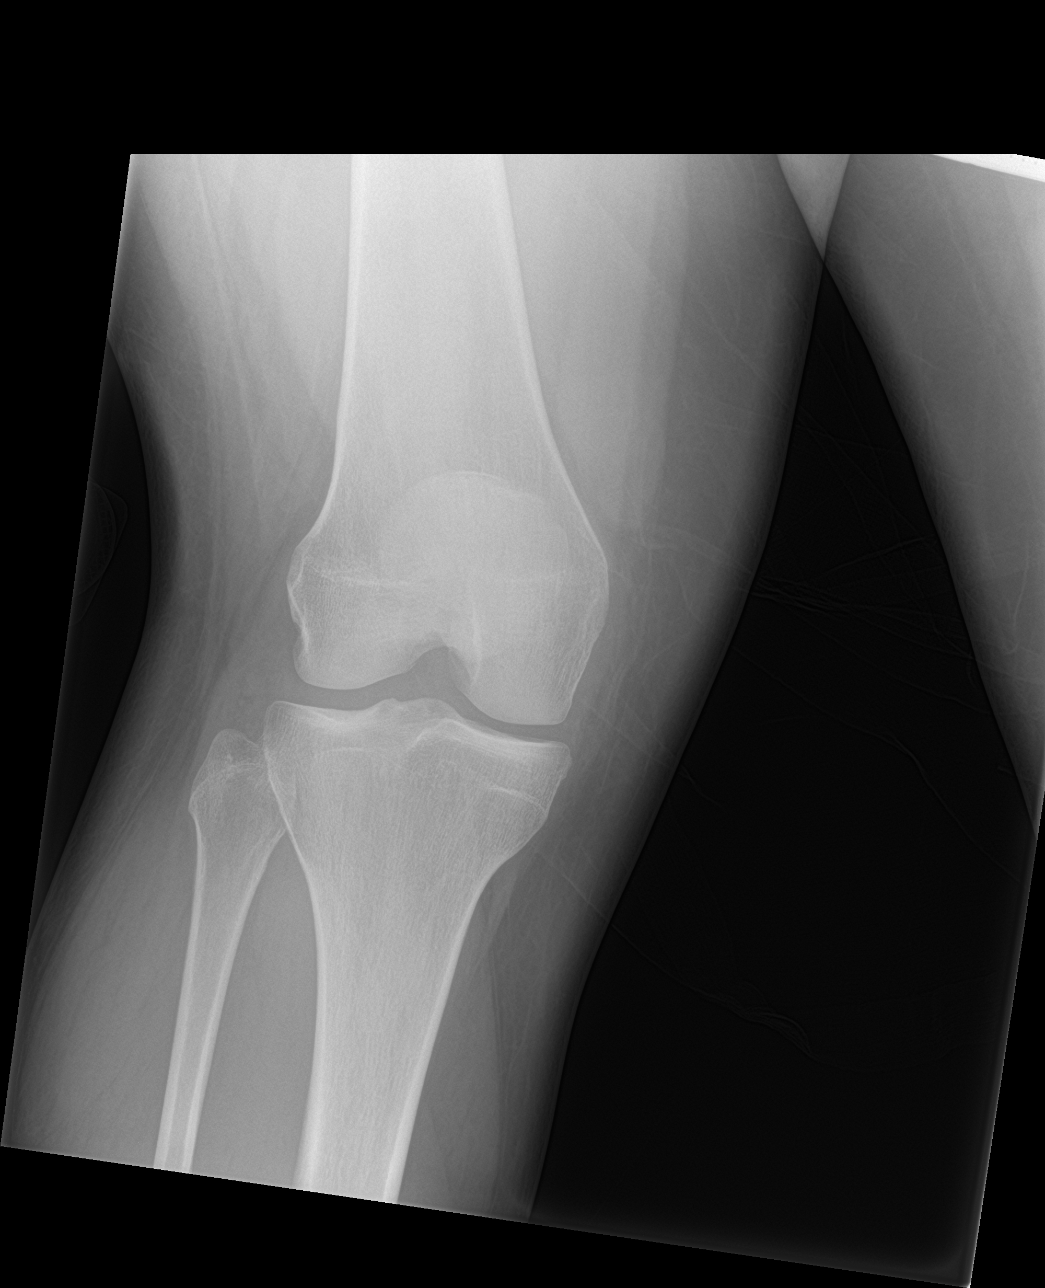

[knee obl (2 of 2)]
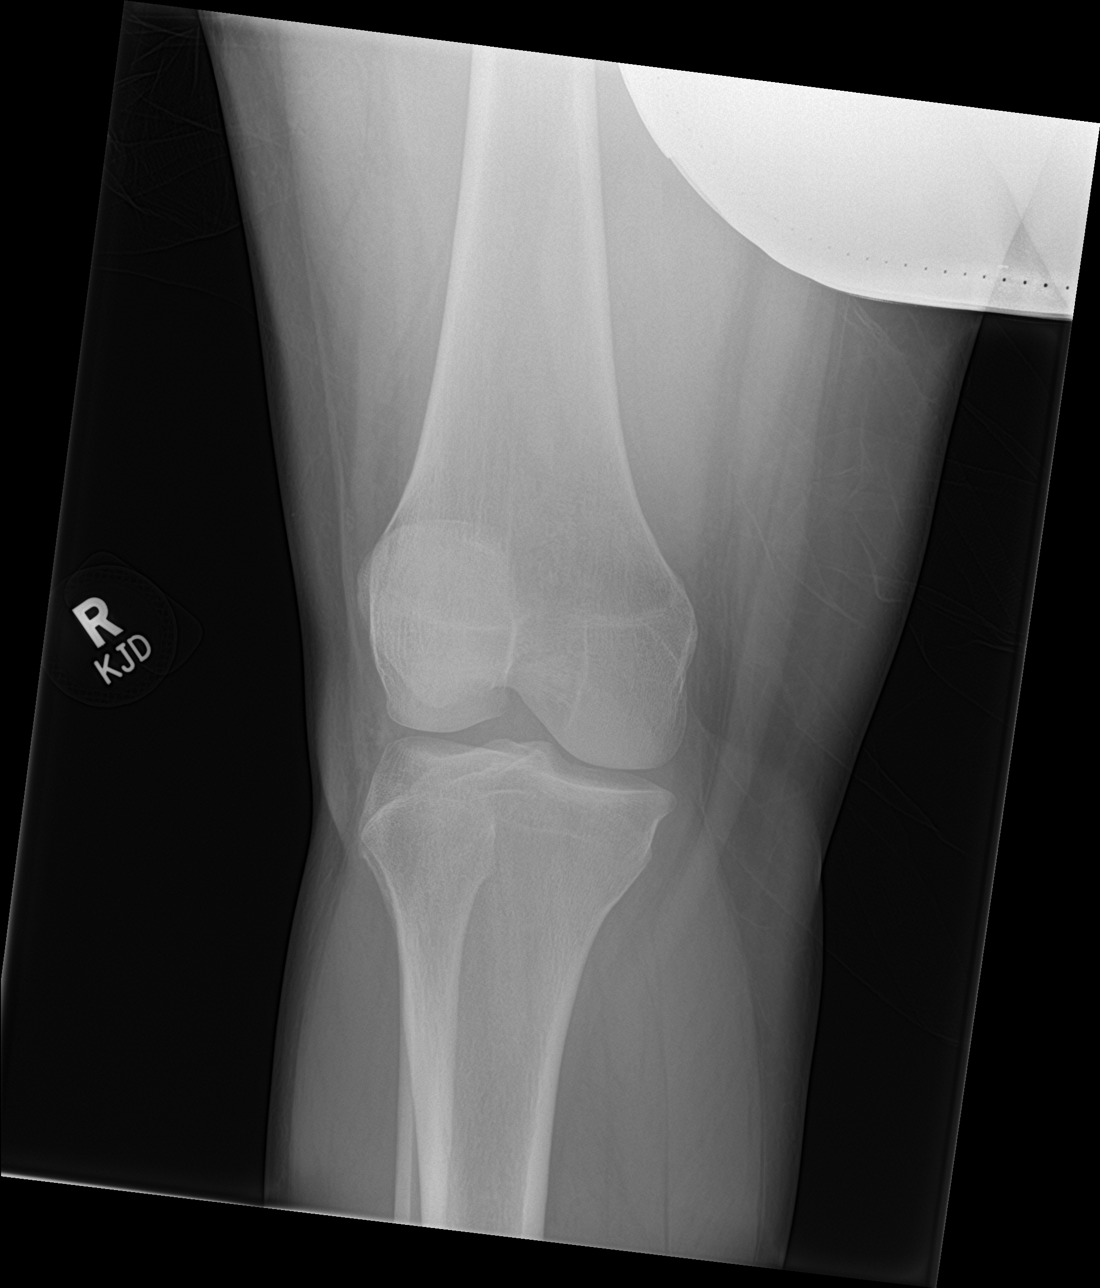

[4 of 4 positions shown; findings below may reference images not displayed]

FINDINGS: No fracture or dislocation is seen.

The joint spaces are preserved.

Visualized soft tissues are within normal limits.

No suprapatellar knee joint effusion.
IMPRESSION: Negative.

## 2022-06-13 ENCOUNTER — Emergency Department (HOSPITAL_COMMUNITY)
Admission: EM | Admit: 2022-06-13 | Discharge: 2022-06-13 | Disposition: A | Discharge status: Home / Self Care | Payer: Self-pay | Attending: Emergency Medicine | Admitting: Emergency Medicine

## 2022-06-13 ENCOUNTER — Other Ambulatory Visit: Payer: Self-pay

## 2022-06-13 ENCOUNTER — Encounter (HOSPITAL_COMMUNITY): Payer: Self-pay | Admitting: Emergency Medicine

## 2022-06-13 DIAGNOSIS — J45909 Unspecified asthma, uncomplicated: Secondary | ICD-10-CM | POA: Insufficient documentation

## 2022-06-13 DIAGNOSIS — J4521 Mild intermittent asthma with (acute) exacerbation: Secondary | ICD-10-CM

## 2022-06-13 DIAGNOSIS — R0602 Shortness of breath: Secondary | ICD-10-CM | POA: Insufficient documentation

## 2022-06-13 MED ORDER — PREDNISONE 10 MG PO TABS: 40.0000 mg | ORAL_TABLET | Freq: Every day | ORAL | 0 refills | Status: AC

## 2022-06-13 MED ORDER — ALBUTEROL SULFATE HFA 108 (90 BASE) MCG/ACT IN AERS: 2.0000 | INHALATION_SPRAY | RESPIRATORY_TRACT | 2 refills | Status: DC | PRN

## 2022-06-13 MED ORDER — ALBUTEROL SULFATE HFA 108 (90 BASE) MCG/ACT IN AERS
2.0000 | INHALATION_SPRAY | RESPIRATORY_TRACT | Status: DC | PRN
  Administered 2022-06-13: 2 via RESPIRATORY_TRACT
  Filled 2022-06-13: qty 6.7
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
## 2022-06-13 NOTE — ED Notes (Signed)
Patient reports significant improvement in shortness of breath after albuterol administration.
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
## 2022-06-13 NOTE — Discharge Instructions (Addendum)
Use your inhaler as needed for your symptoms. Begin taking the steroids to help with your asthma exacerbation as well. Return to the ER if you start to experience worsening symptoms, if you develop chest pain, vomiting or coughing up blood, leg swelling.

## 2022-11-25 ENCOUNTER — Other Ambulatory Visit: Payer: Self-pay

## 2022-11-25 ENCOUNTER — Emergency Department (HOSPITAL_COMMUNITY): Payer: No Typology Code available for payment source

## 2022-11-25 ENCOUNTER — Emergency Department (HOSPITAL_COMMUNITY)
Admission: EM | Admit: 2022-11-25 | Discharge: 2022-11-25 | Discharge status: Against Medical Advice | Payer: No Typology Code available for payment source | Attending: Emergency Medicine | Admitting: Emergency Medicine

## 2022-11-25 DIAGNOSIS — M25561 Pain in right knee: Secondary | ICD-10-CM | POA: Diagnosis not present

## 2022-11-25 DIAGNOSIS — S0081XA Abrasion of other part of head, initial encounter: Secondary | ICD-10-CM | POA: Insufficient documentation

## 2022-11-25 DIAGNOSIS — Z5321 Procedure and treatment not carried out due to patient leaving prior to being seen by health care provider: Secondary | ICD-10-CM | POA: Insufficient documentation

## 2022-11-25 DIAGNOSIS — Y92410 Unspecified street and highway as the place of occurrence of the external cause: Secondary | ICD-10-CM | POA: Diagnosis not present

## 2022-11-25 LAB — CBC
HCT: 39.1 % (ref 36.0–46.0)
Hemoglobin: 13.2 g/dL (ref 12.0–15.0)
MCH: 28 pg (ref 26.0–34.0)
MCHC: 33.8 g/dL (ref 30.0–36.0)
MCV: 83 fL (ref 80.0–100.0)
Platelets: 373 10*3/uL (ref 150–400)
RBC: 4.71 MIL/uL (ref 3.87–5.11)
RDW: 16.1 % — ABNORMAL HIGH (ref 11.5–15.5)
WBC: 8.1 10*3/uL (ref 4.0–10.5)
nRBC: 0 % (ref 0.0–0.2)

## 2022-11-25 LAB — COMPREHENSIVE METABOLIC PANEL
ALT: 25 U/L (ref 0–44)
AST: 28 U/L (ref 15–41)
Albumin: 4.1 g/dL (ref 3.5–5.0)
Alkaline Phosphatase: 66 U/L (ref 38–126)
Anion gap: 10 (ref 5–15)
BUN: 6 mg/dL (ref 6–20)
CO2: 19 mmol/L — ABNORMAL LOW (ref 22–32)
Calcium: 8.9 mg/dL (ref 8.9–10.3)
Chloride: 113 mmol/L — ABNORMAL HIGH (ref 98–111)
Creatinine, Ser: 0.68 mg/dL (ref 0.44–1.00)
GFR, Estimated: >60 mL/min (ref >60)
Glucose, Bld: 93 mg/dL (ref 70–99)
Potassium: 3.6 mmol/L (ref 3.5–5.1)
Sodium: 142 mmol/L (ref 135–145)
Total Bilirubin: 0.5 mg/dL (ref 0.3–1.2)
Total Protein: 7.4 g/dL (ref 6.5–8.1)

## 2022-11-25 LAB — I-STAT BETA HCG BLOOD, ED (MC, WL, AP ONLY): I-stat hCG, quantitative: <5 m[IU]/mL (ref <5)
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
## 2022-11-25 NOTE — ED Notes (Signed)
Called for pt x3 in lobby for room, checked room pt was placed in and checked triage. Pt was not located.

## 2022-12-07 ENCOUNTER — Other Ambulatory Visit: Payer: Self-pay

## 2022-12-07 ENCOUNTER — Emergency Department (HOSPITAL_COMMUNITY)
Admission: EM | Admit: 2022-12-07 | Discharge: 2022-12-07 | Disposition: A | Discharge status: Home / Self Care | Payer: No Typology Code available for payment source | Attending: Emergency Medicine | Admitting: Emergency Medicine

## 2022-12-07 ENCOUNTER — Emergency Department (HOSPITAL_COMMUNITY): Payer: No Typology Code available for payment source

## 2022-12-07 ENCOUNTER — Ambulatory Visit (HOSPITAL_COMMUNITY): Admission: EM | Admit: 2022-12-07 | Discharge: 2022-12-07 | Discharge status: Against Medical Advice | Payer: Self-pay

## 2022-12-07 DIAGNOSIS — L03211 Cellulitis of face: Secondary | ICD-10-CM | POA: Insufficient documentation

## 2022-12-07 DIAGNOSIS — R220 Localized swelling, mass and lump, head: Secondary | ICD-10-CM

## 2022-12-07 DIAGNOSIS — K029 Dental caries, unspecified: Secondary | ICD-10-CM | POA: Diagnosis not present

## 2022-12-07 DIAGNOSIS — K0889 Other specified disorders of teeth and supporting structures: Secondary | ICD-10-CM | POA: Diagnosis present

## 2022-12-07 LAB — I-STAT CHEM 8, ED
BUN: 4 mg/dL — ABNORMAL LOW (ref 6–20)
Calcium, Ion: 1.19 mmol/L (ref 1.15–1.40)
Chloride: 105 mmol/L (ref 98–111)
Creatinine, Ser: 0.7 mg/dL (ref 0.44–1.00)
Glucose, Bld: 88 mg/dL (ref 70–99)
HCT: 41 % (ref 36.0–46.0)
Hemoglobin: 13.9 g/dL (ref 12.0–15.0)
Potassium: 3.3 mmol/L — ABNORMAL LOW (ref 3.5–5.1)
Sodium: 139 mmol/L (ref 135–145)
TCO2: 22 mmol/L (ref 22–32)

## 2022-12-07 LAB — CBC
HCT: 38.5 % (ref 36.0–46.0)
Hemoglobin: 12.7 g/dL (ref 12.0–15.0)
MCH: 28.3 pg (ref 26.0–34.0)
MCHC: 33 g/dL (ref 30.0–36.0)
MCV: 85.9 fL (ref 80.0–100.0)
Platelets: 330 10*3/uL (ref 150–400)
RBC: 4.48 MIL/uL (ref 3.87–5.11)
RDW: 16.4 % — ABNORMAL HIGH (ref 11.5–15.5)
WBC: 15.4 10*3/uL — ABNORMAL HIGH (ref 4.0–10.5)
nRBC: 0 % (ref 0.0–0.2)

## 2022-12-07 MED ORDER — LIDOCAINE VISCOUS HCL 2 % MT SOLN: 5.0000 mL | Freq: Three times a day (TID) | OROMUCOSAL | 0 refills | Status: DC

## 2022-12-07 MED ORDER — AMOXICILLIN-POT CLAVULANATE 875-125 MG PO TABS: 1.0000 | ORAL_TABLET | Freq: Two times a day (BID) | ORAL | 0 refills | Status: DC

## 2022-12-07 MED ORDER — HYDROCODONE-ACETAMINOPHEN 5-325 MG PO TABS
1.0000 | ORAL_TABLET | Freq: Once | ORAL | Status: AC
Start: 2022-12-07 — End: 2022-12-07
  Administered 2022-12-07: 1 via ORAL
  Filled 2022-12-07: qty 1

## 2022-12-07 MED ORDER — IOHEXOL 350 MG/ML SOLN
75.0000 mL | Freq: Once | INTRAVENOUS | Status: AC | PRN
  Administered 2022-12-07: 75 mL via INTRAVENOUS

## 2022-12-07 NOTE — ED Triage Notes (Signed)
Pt here complaining of of tooth pain that started 3 days ago. (Upper left). Pt woke up this am with swollen left upper cheek. Pt says same is throbbing and 9/10. No difficulty speaking, breathing or swallowing. Ao/4 no distress.

## 2022-12-07 NOTE — ED Provider Notes (Incomplete)
[facility] EMERGENCY DEPARTMENT Provider Note   CSN: [id number] Arrival date & time: 12/07/22  1022     History  Chief Complaint  Patient presents with   Dental Pain   Abscess    L Facial swelling     Hannah Meyer is a 26 y.o. female with medical history of asthma.  Patient presents to ED for evaluation of left-sided facial swelling.  States that for the last 3 days she has had left-sided upper dental pain.  Patient states that last night the pain resolved however she woke up this morning with swelling to the left side of her face.  Patient states that she has not seen a dentist in quite some time secondary to insurance issues.  The patient denies any fevers, nausea, vomiting, trouble swallowing.   Dental Pain Associated symptoms: facial swelling   Associated symptoms: no drooling and no fever   Abscess Associated symptoms: no fever, no nausea and no vomiting        Home Medications Prior to Admission medications   Medication Sig Start Date End Date Taking? Authorizing Provider  acetaminophen (TYLENOL) 325 MG tablet Take 2 tablets (650 mg total) by mouth every 6 (six) hours as needed for fever. 09/21/15   Renne Crigler, PA-C  albuterol (VENTOLIN HFA) 108 (90 Base) MCG/ACT inhaler Inhale 2 puffs into the lungs every 4 (four) hours as needed for wheezing or shortness of breath. 06/13/22   Khatri, Hina, PA-C  naproxen (NAPROSYN) 375 MG tablet Take 1 tablet (375 mg total) by mouth 2 (two) times daily. 01/06/22   Marita Kansas, PA-C      Allergies    Patient has no known allergies.    Review of Systems   Review of Systems  Constitutional:  Negative for fever.  HENT:  Positive for dental problem and facial swelling. Negative for drooling and trouble swallowing.   Gastrointestinal:  Negative for diarrhea, nausea and vomiting.  All other systems reviewed and are negative.   Physical Exam Updated Vital Signs BP (!) 143/100 (BP Location: Right Arm)   Pulse  96   Temp 99 F (37.2 C) (Oral)   Resp 16   LMP 11/28/2022   SpO2 98%  Physical Exam Vitals and nursing note reviewed.  Constitutional:      General: She is not in acute distress.    Appearance: Normal appearance. She is not ill-appearing, toxic-appearing or diaphoretic.  HENT:     Head: Normocephalic and atraumatic.     Comments: Soft tissue swelling noted to the left cheek, no overlying skin change    Nose: Nose normal. No congestion.     Mouth/Throat:     Mouth: Mucous membranes are moist.     Pharynx: Oropharynx is clear.  Eyes:     Extraocular Movements: Extraocular movements intact.     Conjunctiva/sclera: Conjunctivae normal.     Pupils: Pupils are equal, round, and reactive to light.  Cardiovascular:     Rate and Rhythm: Normal rate and regular rhythm.  Pulmonary:     Effort: Pulmonary effort is normal.     Breath sounds: Normal breath sounds. No wheezing.  Abdominal:     General: Abdomen is flat. Bowel sounds are normal.     Palpations: Abdomen is soft.     Tenderness: There is no abdominal tenderness.  Musculoskeletal:     Cervical back: Normal range of motion and neck supple.  Skin:    General: Skin is warm and dry.  Capillary Refill: Capillary refill takes less than 2 seconds.  Neurological:     Mental Status: She is alert and oriented to person, place, and time.     ED Results / Procedures / Treatments   Labs (all labs ordered are listed, but only abnormal results are displayed) Labs Reviewed - No data to display  EKG None  Radiology No results found.  Procedures Procedures   Medications Ordered in ED Medications - No data to display  ED Course/ Medical Decision Making/ A&P                           Medical Decision Making Amount and/or Complexity of Data Reviewed Labs: ordered. Radiology: ordered.  Risk Prescription drug management.   26 year old female presents ED for evaluation.  Please see HPI for further  details.    {Document critical care time when appropriate:1} {Document review of labs and clinical decision tools ie heart score, Chads2Vasc2 etc:1}  {Document your independent review of radiology images, and any outside records:1} {Document your discussion with family members, caretakers, and with consultants:1} {Document social determinants of health affecting pt's care:1} {Document your decision making why or why not admission, treatments were needed:1} Final Clinical Impression(s) / ED Diagnoses Final diagnoses:  None    Rx / DC Orders ED Discharge Orders     None

## 2022-12-07 NOTE — Discharge Instructions (Addendum)
Return to ED with any new or worsening signs or symptoms such as fevers, inability to swallow Please follow-up with dentist.  Please utilize attached resource guide to make appointment. Please begin taking Augmentin twice daily for the next 10 days. Please begin taking ibuprofen and Tylenol for pain control.  You can alternate these medications every 3 hours. Please begin utilizing Magic mouthwash as prescribed

## 2022-12-07 NOTE — ED Notes (Signed)
Pt's family approached RN desk asking what the plan was for the pt. RN explained that an IV was recently started with labs and pt was waiting for CT scan. Rn requested pain medication. Family then stated pt would be leaving because they had been here all morning. PA to come see pt.

## 2022-12-07 NOTE — ED Notes (Signed)
Went to car 

## 2022-12-07 NOTE — ED Notes (Signed)
Pt approached desk asking to leave. RN asked pt why she is leaving and she stated that it was taking too long for things to be done. This RN explained that she had CT and labs ordered, RN started IV and sent her labs.

## 2023-02-14 NOTE — ED Provider Notes (Signed)
[facility] Provider Note   CSN: [id number] Arrival date & time: 12/07/22  1022     History  Chief Complaint  Patient presents with   Dental Pain   Abscess    L Facial swelling     Hannah Meyer is a 27 y.o. female who presents to ED for evaluation of 3 days of upper left dental pain. Patient reports she woke up this morning with swollen cheek on left side. Denies fevers, nausea, vomiting. No difficulty phonating, no trouble swallowing. Has not seen dentist in quite some time.   Dental Pain Associated symptoms: facial swelling   Abscess      Home Medications Prior to Admission medications   Medication Sig Start Date End Date Taking? Authorizing Provider  amoxicillin-clavulanate (AUGMENTIN) 875-125 MG tablet Take 1 tablet by mouth every 12 (twelve) hours. 12/07/22  Yes Azucena Cecil, PA-C  magic mouthwash (lidocaine, diphenhydrAMINE, alum & mag hydroxide) suspension Swish and spit 5 mLs 3 (three) times daily. 12/07/22  Yes Azucena Cecil, PA-C  acetaminophen (TYLENOL) 325 MG tablet Take 2 tablets (650 mg total) by mouth every 6 (six) hours as needed for fever. 09/21/15   Carlisle Cater, PA-C  albuterol (VENTOLIN HFA) 108 (90 Base) MCG/ACT inhaler Inhale 2 puffs into the lungs every 4 (four) hours as needed for wheezing or shortness of breath. 06/13/22   Khatri, Hina, PA-C  naproxen (NAPROSYN) 375 MG tablet Take 1 tablet (375 mg total) by mouth 2 (two) times daily. 01/06/22   Evlyn Courier, PA-C      Allergies    Patient has no known allergies.    Review of Systems   Review of Systems  HENT:  Positive for dental problem and facial swelling.   All other systems reviewed and are negative.   Physical Exam Updated Vital Signs BP (!) 143/100 (BP Location: Right Arm)   Pulse 96   Temp 99 F (37.2 C) (Oral)   Resp 16   LMP 11/28/2022   SpO2 98%  Physical Exam Vitals and nursing note reviewed.  Constitutional:       General: She is not in acute distress.    Appearance: She is not toxic-appearing.  HENT:     Head: Normocephalic and atraumatic.     Comments: Airway intact, handling secretions appropriately, uvula midline, no sign of RPA, PTA. Dental caries noted throughout with soft tissue swelling to left cheek.    Mouth/Throat:     Mouth: Mucous membranes are moist.     Pharynx: Oropharynx is clear.  Eyes:     Conjunctiva/sclera: Conjunctivae normal.     Pupils: Pupils are equal, round, and reactive to light.  Cardiovascular:     Rate and Rhythm: Normal rate and regular rhythm.  Pulmonary:     Effort: No respiratory distress.  Abdominal:     General: Abdomen is flat.     Tenderness: There is no abdominal tenderness.  Musculoskeletal:     Cervical back: Normal range of motion and neck supple. No tenderness.  Skin:    Coloration: Skin is not jaundiced or pale.  Neurological:     Mental Status: She is alert and oriented to person, place, and time.  Psychiatric:        Behavior: Behavior normal.     ED Results / Procedures / Treatments   Labs (all labs ordered are listed, but only abnormal results are displayed) Labs Reviewed  CBC - Abnormal; Notable for the following components:  Result Value   WBC 15.4 (*)    RDW 16.4 (*)    All other components within normal limits  I-STAT CHEM 8, ED - Abnormal; Notable for the following components:   Potassium 3.3 (*)    BUN 4 (*)    All other components within normal limits    EKG None  Radiology No results found.  Procedures Procedures    Medications Ordered in ED Medications  HYDROcodone-acetaminophen (NORCO/VICODIN) 5-325 MG per tablet 1 tablet (1 tablet Oral Given 12/07/22 1308)  iohexol (OMNIPAQUE) 350 MG/ML injection 75 mL (75 mLs Intravenous Contrast Given 12/07/22 1446)    ED Course/ Medical Decision Making/ A&P                             Medical Decision Making Amount and/or Complexity of Data Reviewed Labs:  ordered. Radiology: ordered.  Risk Prescription drug management.   26YOF presents to ED for evaluation. Please see HPI for further details.   On exam patient with soft tissue swelling to left side of face. CT maxillofacial ordered, '5mg'$  hydrocodone for pain control.  CT shows evidence of cellulitis however no abscess. Patient will be sent home with augmentin, magic mouthwash, list of dental resources to follow up with. Return precautions provided, patient understood. Patient discharged in stable condition.    Final Clinical Impression(s) / ED Diagnoses Final diagnoses:  Pain due to dental caries  Facial swelling    Rx / DC Orders ED Discharge Orders          Ordered    amoxicillin-clavulanate (AUGMENTIN) 875-125 MG tablet  Every 12 hours        12/07/22 1514    magic mouthwash (lidocaine, diphenhydrAMINE, alum & mag hydroxide) suspension  3 times daily        12/07/22 1514              Azucena Cecil, [place] 02/14/23 1136    Lajean Saver, MD 02/14/23 1505

## 2023-02-24 ENCOUNTER — Encounter (HOSPITAL_COMMUNITY): Payer: Self-pay

## 2023-02-24 ENCOUNTER — Emergency Department (HOSPITAL_COMMUNITY): Payer: Self-pay

## 2023-02-24 ENCOUNTER — Emergency Department (HOSPITAL_COMMUNITY)
Admission: EM | Admit: 2023-02-24 | Discharge: 2023-02-24 | Disposition: A | Discharge status: Home / Self Care | Payer: Self-pay | Attending: Emergency Medicine | Admitting: Emergency Medicine

## 2023-02-24 DIAGNOSIS — M25361 Other instability, right knee: Secondary | ICD-10-CM

## 2023-02-24 DIAGNOSIS — M25561 Pain in right knee: Secondary | ICD-10-CM | POA: Insufficient documentation

## 2023-02-24 LAB — POC URINE PREG, ED: Preg Test, Ur: NEGATIVE

## 2023-02-24 MED ORDER — ACETAMINOPHEN 500 MG PO TABS
1000.0000 mg | ORAL_TABLET | Freq: Once | ORAL | Status: AC
  Administered 2023-02-24: 1000 mg via ORAL
  Filled 2023-02-24: qty 2

## 2023-02-24 MED ORDER — IBUPROFEN 400 MG PO TABS
600.0000 mg | ORAL_TABLET | Freq: Once | ORAL | Status: AC
  Administered 2023-02-24: 600 mg via ORAL
  Filled 2023-02-24: qty 1

## 2023-02-24 NOTE — ED Triage Notes (Signed)
Pt states that her right knee "pops out of place" sometimes and it has done so. Pt states she had a fall last year that caused her knee instability.

## 2023-02-24 NOTE — Discharge Instructions (Signed)
You were seen in the emergency department for your knee pain.  You had no obvious dislocation at this time but it sounds like you likely have your knee Intermittently dislocating that is causing your pain.  I have given you a knee brace and you should wear this daily whenever you are up and ambulating.  You can take it off to shower and to sleep at night.  You can continue to take Tylenol and Motrin as needed for pain and both can be taken up to every 6 hours.  You can ice your knee and keep it elevated to help with the swelling.  You can follow-up with orthopedics for a recheck in for further management.  You should return to the emergency department if your kneecap dislocates and does not go back on its own, you start to get numbness in your leg, you have fevers or if you have any other new or concerning symptoms.

## 2023-02-24 NOTE — ED Notes (Signed)
Ortho tech called, en route
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
## 2023-02-24 NOTE — Progress Notes (Signed)
Orthopedic Tech Progress Note Patient Details:  Hannah Meyer 1996/03/28 [id number]  Neoprene knee sleeve applied to RLE.  Ortho Devices Type of Ortho Device: Knee Sleeve Ortho Device/Splint Location: RLE Ortho Device/Splint Interventions: Ordered, Application, Adjustment   Post Interventions Patient Tolerated: Well Instructions Provided: Care of device, Adjustment of device  Tiwanda Threats Jeri Modena 02/24/2023, 9:55 AM

## 2023-02-24 NOTE — ED Notes (Signed)
ED Provider at bedside. 

## 2023-04-05 ENCOUNTER — Emergency Department (HOSPITAL_COMMUNITY): Payer: Self-pay

## 2023-04-05 ENCOUNTER — Other Ambulatory Visit: Payer: Self-pay

## 2023-04-05 ENCOUNTER — Emergency Department (HOSPITAL_COMMUNITY)
Admission: EM | Admit: 2023-04-05 | Discharge: 2023-04-05 | Disposition: A | Discharge status: Home / Self Care | Payer: Self-pay | Attending: Student | Admitting: Student

## 2023-04-05 DIAGNOSIS — Z7722 Contact with and (suspected) exposure to environmental tobacco smoke (acute) (chronic): Secondary | ICD-10-CM | POA: Insufficient documentation

## 2023-04-05 DIAGNOSIS — J45909 Unspecified asthma, uncomplicated: Secondary | ICD-10-CM | POA: Insufficient documentation

## 2023-04-05 DIAGNOSIS — G8929 Other chronic pain: Secondary | ICD-10-CM | POA: Insufficient documentation

## 2023-04-05 DIAGNOSIS — M25561 Pain in right knee: Secondary | ICD-10-CM | POA: Insufficient documentation

## 2023-04-05 MED ORDER — KETOROLAC TROMETHAMINE 15 MG/ML IJ SOLN
15.0000 mg | Freq: Once | INTRAMUSCULAR | Status: AC
  Administered 2023-04-05: 15 mg via INTRAMUSCULAR
  Filled 2023-04-05: qty 1

## 2023-04-05 MED ORDER — NAPROXEN 375 MG PO TABS: 375.0000 mg | ORAL_TABLET | Freq: Two times a day (BID) | ORAL | 0 refills | Status: DC

## 2023-04-05 MED ORDER — LIDOCAINE 5 % EX PTCH
1.0000 | MEDICATED_PATCH | CUTANEOUS | Status: DC
  Administered 2023-04-05: 1 via TRANSDERMAL
  Filled 2023-04-05: qty 1

## 2023-04-05 MED ORDER — LIDOCAINE 5 % EX PTCH: 1.0000 | MEDICATED_PATCH | CUTANEOUS | 0 refills | Status: DC

## 2023-04-05 NOTE — Progress Notes (Signed)
Orthopedic Tech Progress Note Patient Details:  Hannah Meyer 08/28/1996 [id number]  Ortho Devices Type of Ortho Device: Knee Sleeve, Crutches Ortho Device/Splint Location: rle Ortho Device/Splint Interventions: Ordered, Application, Adjustment  The patient had a lido patch on their knee so I taught them how to apply the sleeve so they apply it when the patch comes off. The dr approved this. Post Interventions Patient Tolerated: Well Instructions Provided: Care of device, Adjustment of device  Trinna Post 04/05/2023, 5:06 AM

## 2023-04-05 NOTE — ED Notes (Signed)
Patient presents to room via wc c/o right knee pain. Patient has had previous problems with this knee and denies injury Pt states she can not straighten it and it hurts too bad to walk on right now. When asked what happened pt stated she was walking and it just gave out. Pt states she has been seen several times for this and had a brace that helped but she lost it. Ice pack applied. Cms intact pt has full rom no swelling noted pt currently asleep.

## 2023-04-05 NOTE — ED Triage Notes (Addendum)
Pt says that she is having right knee pain, has had similar pain before, cannot straighten her knee. Denies injury

## 2023-04-05 NOTE — ED Provider Notes (Signed)
[place] EMERGENCY DEPARTMENT AT [facility] Provider Note  CSN: [id number] Arrival date & time: 04/05/23 0246  Chief Complaint(s) Knee Pain  HPI Cabrini Gruetzmacher is a 27 y.o. female with PMH asthma, chronic right-sided knee pain who presents emergency room for evaluation of acute on chronic right knee pain.  Patient states that she suffered a fall while playing softball approximately 3 months ago and was seen in the emergency department at that time with overall negative imaging workup.  She has had persistent knee pain since then and takes only Tylenol PM for pain.  Has not followed up with orthopedics.  Denies recent trauma or recent fall.  Denies numbness, tingling, weakness or other neurologic complaints.   Past Medical History Past Medical History:  Diagnosis Date   Asthma    There are no problems to display for this patient.  Home Medication(s) Prior to Admission medications   Medication Sig Start Date End Date Taking? Authorizing Provider  acetaminophen (TYLENOL) 325 MG tablet Take 2 tablets (650 mg total) by mouth every 6 (six) hours as needed for fever. 09/21/15   Renne Crigler, PA-C  albuterol (VENTOLIN HFA) 108 (90 Base) MCG/ACT inhaler Inhale 2 puffs into the lungs every 4 (four) hours as needed for wheezing or shortness of breath. 06/13/22   Khatri, Hina, PA-C  amoxicillin-clavulanate (AUGMENTIN) 875-125 MG tablet Take 1 tablet by mouth every 12 (twelve) hours. 12/07/22   Al Decant, PA-C  magic mouthwash (lidocaine, diphenhydrAMINE, alum & mag hydroxide) suspension Swish and spit 5 mLs 3 (three) times daily. 12/07/22   Al Decant, PA-C  naproxen (NAPROSYN) 375 MG tablet Take 1 tablet (375 mg total) by mouth 2 (two) times daily. 01/06/22   Marita Kansas, PA-C                                                                                                                                    Past Surgical History No past surgical history on  file. Family History No family history on file.  Social History Social History   Tobacco Use   Smoking status: Passive Smoke Exposure - Never Smoker   Smokeless tobacco: Never  Substance Use Topics   Alcohol use: No   Drug use: Yes    Types: Marijuana   Allergies Patient has no known allergies.  Review of Systems Review of Systems  Musculoskeletal:  Positive for arthralgias.    Physical Exam Vital Signs  I have reviewed the triage vital signs BP 139/89 (BP Location: Right Arm)   Pulse (!) 58   Temp 98.1 F (36.7 C) (Oral)   Resp 16   LMP 03/15/2023   SpO2 98%   Physical Exam Vitals and nursing note reviewed.  Constitutional:      General: She is not in acute distress.    Appearance: She is well-developed.  HENT:     Head: Normocephalic and atraumatic.  Eyes:  Conjunctiva/sclera: Conjunctivae normal.  Cardiovascular:     Rate and Rhythm: Normal rate and regular rhythm.     Heart sounds: No murmur heard. Pulmonary:     Effort: Pulmonary effort is normal. No respiratory distress.     Breath sounds: Normal breath sounds.  Abdominal:     Palpations: Abdomen is soft.     Tenderness: There is no abdominal tenderness.  Musculoskeletal:        General: Swelling and tenderness present.     Cervical back: Neck supple.  Skin:    General: Skin is warm and dry.     Capillary Refill: Capillary refill takes less than 2 seconds.  Neurological:     Mental Status: She is alert.  Psychiatric:        Mood and Affect: Mood normal.     ED Results and Treatments Labs (all labs ordered are listed, but only abnormal results are displayed) Labs Reviewed - No data to display                                                                                                                        Radiology DG Knee Complete 4 Views Right  Result Date: 04/05/2023 CLINICAL DATA:  Intermittent right knee pain. EXAM: RIGHT KNEE - COMPLETE 4+ VIEW COMPARISON:  February 24, 2023  FINDINGS: No evidence of fracture, dislocation, or joint effusion. No evidence of arthropathy or other focal bone abnormality. Soft tissues are unremarkable. IMPRESSION: Negative. Electronically Signed   By: Aram Candela M.D.   On: 04/05/2023 03:16    Pertinent labs & imaging results that were available during my care of the patient were reviewed by me and considered in my medical decision making (see MDM for details).  Medications Ordered in ED Medications - No data to display                                                                                                                                   Procedures Procedures  (including critical care time)  Medical Decision Making / ED Course   This patient presents to the ED for concern of knee pain, this involves an extensive number of treatment options, and is a complaint that carries with it a high risk of complications and morbidity.  The differential diagnosis includes fracture, ligamentous injury, hematoma, contusion, dislocation  MDM: Patient presents emergency room for evaluation of acute on chronic  right knee pain.  Physical exam with mild tenderness worse in the medial aspect of the knee but neurologic exam unremarkable.  Patient able to range the knee without difficulty.  X-ray without fracture.  Patient given Toradol for pain control and a knee brace was placed.  Patient given crutches for mobility and given resources to follow-up outpatient with orthopedics for further evaluation.  At this time, patient does not meet inpatient criteria for admission she is safe for discharge with outpatient orthopedic follow-up  Additional history obtained:  -External records from outside source obtained and reviewed including: Chart review including previous notes, labs, imaging, consultation notes    Imaging Studies ordered: I ordered imaging studies including knee XR I independently visualized and interpreted imaging. I agree with  the radiologist interpretation   Medicines ordered and prescription drug management: No orders of the defined types were placed in this encounter.   -I have reviewed the patients home medicines and have made adjustments as needed  Critical interventions none    Social Determinants of Health:  Factors impacting patients care include: none   Reevaluation: After the interventions noted above, I reevaluated the patient and found that they have :improved  Co morbidities that complicate the patient evaluation  Past Medical History:  Diagnosis Date   Asthma       Dispostion: I considered admission for this patient, but at this time, she does not meet inpatient criteria for admission she is safe for discharge with outpatient follow-up     Final Clinical Impression(s) / ED Diagnoses Final diagnoses:  None     @PCDICTATION @    Glendora Score, MD 04/05/23 [phone]

## 2023-04-05 NOTE — ED Notes (Signed)
Ortho tech notified of needs for ortho equipment and teaching

## 2024-05-27 ENCOUNTER — Encounter (HOSPITAL_COMMUNITY): Payer: Self-pay | Admitting: *Deleted

## 2024-05-27 ENCOUNTER — Inpatient Hospital Stay (HOSPITAL_COMMUNITY)
Admission: AD | Admit: 2024-05-27 | Discharge: 2024-05-27 | Disposition: A | Discharge status: Home / Self Care | Payer: MEDICAID | Attending: Obstetrics & Gynecology | Admitting: Obstetrics & Gynecology

## 2024-05-27 DIAGNOSIS — R030 Elevated blood-pressure reading, without diagnosis of hypertension: Secondary | ICD-10-CM | POA: Diagnosis not present

## 2024-05-27 DIAGNOSIS — N939 Abnormal uterine and vaginal bleeding, unspecified: Secondary | ICD-10-CM | POA: Diagnosis not present

## 2024-05-27 DIAGNOSIS — O26851 Spotting complicating pregnancy, first trimester: Secondary | ICD-10-CM | POA: Diagnosis present

## 2024-05-27 DIAGNOSIS — Z3202 Encounter for pregnancy test, result negative: Secondary | ICD-10-CM | POA: Insufficient documentation

## 2024-05-27 DIAGNOSIS — Z789 Other specified health status: Secondary | ICD-10-CM

## 2024-05-27 LAB — HCG, QUANTITATIVE, PREGNANCY: hCG, Beta Chain, Quant, S: <1 m[IU]/mL (ref <5)

## 2024-05-27 LAB — URINALYSIS, ROUTINE W REFLEX MICROSCOPIC
Bilirubin Urine: NEGATIVE
Glucose, UA: NEGATIVE mg/dL
Ketones, ur: 5 mg/dL — AB
Leukocytes,Ua: NEGATIVE
Nitrite: NEGATIVE
Protein, ur: 30 mg/dL — AB
Specific Gravity, Urine: 1.03 (ref 1.005–1.030)
pH: 5 (ref 5.0–8.0)

## 2024-05-27 LAB — WET PREP, GENITAL
Clue Cells Wet Prep HPF POC: NONE SEEN
Sperm: NONE SEEN
Trich, Wet Prep: NONE SEEN
WBC, Wet Prep HPF POC: >=10 — AB (ref <10)
Yeast Wet Prep HPF POC: NONE SEEN

## 2024-05-27 LAB — POCT PREGNANCY, URINE: Preg Test, Ur: NEGATIVE

## 2024-05-27 NOTE — Discharge Instructions (Signed)
[place] for [facility] at [facility]    Phone: [place] for [facility] at [place]   Phone: [place] for [employer] [facility] at [place]  Phone: [place] for [employer] [facility] at [facility]  Phone: [place] for [employer] [facility] at [place]  Phone: [place] for [place] at [facility]   Phone: [facility]       Phone: [phone]  [facility]    Phone: [phone]   [facility]    Phone: [phone]  [facility]    Phone: [place]    Phone: [phone]  [place] [facility]       Phone: [phone]   [place] [facility]  Phone: [place]    Phone: [phone]  [facility]   Phone: [phone]  [facility]      Phone: [phone]  [facility]    Phone: [phone]

## 2024-05-27 NOTE — MAU Note (Signed)
 Hannah Meyer is a 28 y.o. at Unknown here in MAU reporting: neg UPT here in MAU. Took a preg test beginning of April was +.  Had sex a couple wks later and there was a lot of blood. Sister thought she might of had a miscarriage.  Took another test 4/28, was still +.  Has tried to get an appt, but works all the time, so hasn't been able too.  So really just here for a check.   Cramping rt upper side, lateral. Spotted last wk after sex. Is spotting now also. LMP: mid March Onset of complaint: since April, spotting off and on, pain started this morning Pain score: severe Vitals:   05/27/24 0944  BP: (!) 146/95  Pulse: 67  Resp: 17  Temp: 98.2 F (36.8 C)  SpO2: 98%      Lab orders placed from triage:  UA, UPT, vag swabs   neg UPT here in MAU

## 2024-05-27 NOTE — MAU Provider Note (Signed)
 S Ms. Hannah Meyer is a 28 y.o. G0P0000 patient who presents to MAU today with complaint of vaginal spotting after having a positive UPT at home in April. She has reported bleeding with intercourse but she had another positive UPT at  home that was positive on 04/06/24. She has not established [facility] yet and her UPT here is negative. Discussed with patient will plan for HCG quant and follow appropriately.     O BP (!) 146/95 (BP Location: Right Arm)   Pulse 67   Temp 98.2 F (36.8 C) (Oral)   Resp 17   Ht 5' 4 (1.626 m)   Wt 108.9 kg   LMP 02/22/2024   SpO2 98%   BMI 41.21 kg/m  Physical Exam Vitals and nursing note reviewed.  Constitutional:      General: She is not in acute distress.    Appearance: She is well-developed. She is obese. She is not ill-appearing.  HENT:     Head: Normocephalic.     Nose: Nose normal.     Mouth/Throat:     Mouth: Mucous membranes are moist.   Cardiovascular:     Rate and Rhythm: Normal rate.  Pulmonary:     Effort: Pulmonary effort is normal.  Abdominal:     Palpations: Abdomen is soft.     Tenderness: There is no abdominal tenderness.   Musculoskeletal:        General: Normal range of motion.     Cervical back: Normal range of motion.   Skin:    General: Skin is warm.   Neurological:     Mental Status: She is oriented to person, place, and time.   Psychiatric:        Behavior: Behavior normal.     MDM  HIGH  Vaginal bleeding with Neg UPT here)  BP initially elevated 146/95 ( Discussed with patient and advised PCP/GYN follow up ) HCG Quantitative <1 U/A ( Large blood- Likely menses , negative leucocytes, negative nitrates) Vaginal Swabs: Negative No evidence of pregnancy at this time Plan for discharge and  to follow up with PCP/GYN   Orders Placed This Encounter  Procedures   Wet prep, genital    Standing Status:   Standing    Number of Occurrences:   1   Urinalysis, Routine w reflex microscopic -Urine, Clean  Catch    Standing Status:   Standing    Number of Occurrences:   1    Specimen Source:   Urine, Clean Catch [76]   hCG, quantitative, pregnancy    Standing Status:   Standing    Number of Occurrences:   1   Pregnancy, urine POC    Standing Status:   Standing    Number of Occurrences:   1   Discharge patient Discharge disposition: 01-Home or Self Care; Discharge patient date: 05/27/2024    Standing Status:   Standing    Number of Occurrences:   1    Discharge disposition:   01-Home or Self Care [1]    Discharge patient date:   05/27/2024      Results for orders placed or performed during the hospital encounter of 05/27/24 (from the past 24 hours)  Urinalysis, Routine w reflex microscopic -Urine, Clean Catch     Status: Abnormal   Collection Time: 05/27/24  8:59 AM  Result Value Ref Range   Color, Urine YELLOW YELLOW   APPearance CLEAR CLEAR   Specific Gravity, Urine 1.030 1.005 - 1.030   pH  5.0 5.0 - 8.0   Glucose, UA NEGATIVE NEGATIVE mg/dL   Hgb urine dipstick LARGE (A) NEGATIVE   Bilirubin Urine NEGATIVE NEGATIVE   Ketones, ur 5 (A) NEGATIVE mg/dL   Protein, ur 30 (A) NEGATIVE mg/dL   Nitrite NEGATIVE NEGATIVE   Leukocytes,Ua NEGATIVE NEGATIVE   RBC / HPF 0-5 0 - 5 RBC/hpf   WBC, UA 0-5 0 - 5 WBC/hpf   Bacteria, UA RARE (A) NONE SEEN   Squamous Epithelial / HPF 0-5 0 - 5 /HPF   Mucus PRESENT   Wet prep, genital     Status: Abnormal   Collection Time: 05/27/24  8:59 AM   Specimen: Urine, Clean Catch  Result Value Ref Range   Yeast Wet Prep HPF POC NONE SEEN NONE SEEN   Trich, Wet Prep NONE SEEN NONE SEEN   Clue Cells Wet Prep HPF POC NONE SEEN NONE SEEN   WBC, Wet Prep HPF POC >=10 (A) <10   Sperm NONE SEEN   Pregnancy, urine POC     Status: None   Collection Time: 05/27/24  9:17 AM  Result Value Ref Range   Preg Test, Ur NEGATIVE NEGATIVE  hCG, quantitative, pregnancy     Status: None   Collection Time: 05/27/24 10:38 AM  Result Value Ref Range   hCG, Beta Chain,  Quant, S <1 <5 mIU/mL     I have reviewed the patient chart and performed the physical exam . I have ordered & interpreted the lab results and reviewed and interpreted the results with the patient  Medications ordered as stated below.  A/P as described below.  Counseling and education provided and patient agreeable  with plan as described below. Verbalized understanding.    ASSESSMENT Medical screening exam complete 1. Not currently pregnant (Primary)  2. Elevated blood pressure reading without diagnosis of hypertension    PLAN  Discharge from MAU in stable condition  Follow up with PCP/GYN ( List of providers given)  See AVS for full description of educational information and instructions provided to the patient at time of discharge  List of options for follow-up given   Warning signs for worsening condition that would warrant emergency follow-up discussed  Patient may return to MAU as needed   Cherlynn Cornfield, NP 05/27/2024 1:02 PM

## 2024-05-28 LAB — GC/CHLAMYDIA PROBE AMP (~~LOC~~) NOT AT ARMC
Chlamydia: NEGATIVE
Comment: NEGATIVE
Comment: NORMAL
Neisseria Gonorrhea: NEGATIVE

## 2024-07-22 ENCOUNTER — Ambulatory Visit (INDEPENDENT_AMBULATORY_CARE_PROVIDER_SITE_OTHER): Payer: Self-pay | Admitting: Certified Nurse Midwife

## 2024-07-22 VITALS — BP 121/79 | HR 91 | Ht 65.0 in | Wt 243.6 lb

## 2024-07-22 DIAGNOSIS — Z3687 Encounter for antenatal screening for uncertain dates: Secondary | ICD-10-CM

## 2024-07-22 DIAGNOSIS — Z3201 Encounter for pregnancy test, result positive: Secondary | ICD-10-CM

## 2024-07-22 DIAGNOSIS — O219 Vomiting of pregnancy, unspecified: Secondary | ICD-10-CM

## 2024-07-22 LAB — POCT PREGNANCY, URINE: Preg Test, Ur: POSITIVE — AB

## 2024-07-22 MED ORDER — PROMETHAZINE HCL 25 MG PO TABS: 25.0000 mg | ORAL_TABLET | Freq: Four times a day (QID) | ORAL | 0 refills | Status: DC | PRN

## 2024-07-22 NOTE — Patient Instructions (Addendum)
 Prenatal Care Providers           Center for [facility] @ [facility]  [address] [phone]  Center for [facility] @ [facility]   [address]  [phone]  [facility] @ [facility]       [address] [phone]            Center for [facility] @ [place]     [phone]          Center for [facility] @ [facility]   [address] [phone]  Center for [facility] @ [facility]  [address] [phone]     Center for [facility] @ [address] [place])  [address]   [phone]     [facility]  Phone: [phone]  [facility]  Phone: [phone]  [facility] Phone: [phone]  [facility] Phone: [phone]  [facility] Phone: [phone]  [facility] Phone: [phone]  [facility]  Phone: [phone]    Vitamin B6 100 mg twice a day

## 2024-07-22 NOTE — Progress Notes (Signed)
 Here for pregnancy test which was positive. She reports spotted on 05/27/24 for 3 days. Usual period is 5 days heavy bleeding. We discussed she needs dating US  to confirm EDD. US  scheduled. She is unsure where she will go for pregnancy. List of providers in area placed in her AVS. This is her first pregnancy. She denies any health issues besides asthma. I advised her to start prenatal care. She is getting insurance through her work. She c/o nausea everyday. Phenergan  rx offered, accepted and sent to her pharmacy. Cornell Finder, CNM in to greet patient. Advised may also take Vitamin B6 for nausea.  Rock Skip PEAK

## 2024-07-30 ENCOUNTER — Other Ambulatory Visit: Payer: Self-pay

## 2024-08-05 ENCOUNTER — Other Ambulatory Visit: Payer: Self-pay

## 2024-08-05 ENCOUNTER — Telehealth (INDEPENDENT_AMBULATORY_CARE_PROVIDER_SITE_OTHER): Payer: Self-pay

## 2024-08-05 DIAGNOSIS — Z3402 Encounter for supervision of normal first pregnancy, second trimester: Secondary | ICD-10-CM | POA: Insufficient documentation

## 2024-08-05 DIAGNOSIS — Z3401 Encounter for supervision of normal first pregnancy, first trimester: Secondary | ICD-10-CM | POA: Insufficient documentation

## 2024-08-05 DIAGNOSIS — Z3689 Encounter for other specified antenatal screening: Secondary | ICD-10-CM

## 2024-08-05 MED ORDER — BLOOD PRESSURE MONITORING DEVI: 1.0000 | 0 refills | Status: AC

## 2024-08-05 MED ORDER — PRENATAL PLUS 27-1 MG PO TABS: 1.0000 | ORAL_TABLET | Freq: Every day | ORAL | 11 refills | Status: AC

## 2024-08-05 NOTE — Patient Instructions (Signed)

## 2024-08-05 NOTE — Progress Notes (Signed)
 243.0New OB Intake  I connected with Hannah Meyer  on 08/05/24 at  1:15 PM EDT by MyChart Video Visit and verified that I am speaking with the correct person using two identifiers. Nurse is located at [facility] and pt is located at home.  I discussed the limitations, risks, security and privacy concerns of performing an evaluation and management service by telephone and the availability of in person appointments. I also discussed with the patient that there may be a patient responsible charge related to this service. The patient expressed understanding and agreed to proceed.  I explained I am completing New OB Intake today. We discussed EDD of 03/08/24 based on LMP of 06/01/24. Pt is G1P0000. I reviewed her allergies, medications and Medical/Surgical/OB history.    Patient Active Problem List   Diagnosis Date Noted   Encounter for supervision of normal first pregnancy in first trimester 08/05/2024     Concerns addressed today  Delivery Plans Plans to deliver at [facility]. Discussed the nature of our practice with multiple providers including residents and students as well as female and female providers. Due to the size of the practice, the delivering provider may not be the same as those providing prenatal care.   Patient is not interested in water birth.  MyChart/Babyscripts MyChart access verified. I explained pt will have some visits in office and some virtually. Babyscripts instructions given and order placed. Patient verifies receipt of registration text/e-mail. Account successfully created and app downloaded. If patient is a candidate for Optimized scheduling, add to sticky note.   Blood Pressure Cuff/Weight Scale Blood pressure cuff ordered for patient to pick-up from [employer]. Explained after first prenatal appt pt will check weekly and document in Babyscripts. Patient does have weight scale.  Anatomy US  Explained first scheduled US  will be around 19 weeks. Dating US   pending.   Is patient a CenteringPregnancy candidate?  [facility]       Is patient a Mom+Baby Combined Care candidate?  Accepted   If accepted, confirm patient does not intend to move from the area for at least 12 months, then notify Mom+Baby staff  Is patient a candidate for Babyscripts Optimization?    First visit review I reviewed new OB appt with patient. Explained pt will be seen by Hannah Meyer  at first visit. Discussed Hannah Meyer genetic screening with patient. Needs Panorama and Horizon.. Routine prenatal labs needed at [facility] OB visit.   Last Pap Needs Pap  Hannah Meyer, Hannah Meyer 08/05/2024  2:10 PM

## 2024-08-13 ENCOUNTER — Ambulatory Visit (INDEPENDENT_AMBULATORY_CARE_PROVIDER_SITE_OTHER)

## 2024-08-13 ENCOUNTER — Other Ambulatory Visit (HOSPITAL_COMMUNITY)
Admission: RE | Admit: 2024-08-13 | Discharge: 2024-08-13 | Disposition: A | Discharge status: Home / Self Care | Source: Ambulatory Visit | Attending: Obstetrics and Gynecology | Admitting: Obstetrics and Gynecology

## 2024-08-13 ENCOUNTER — Ambulatory Visit (INDEPENDENT_AMBULATORY_CARE_PROVIDER_SITE_OTHER): Payer: Self-pay | Admitting: Obstetrics and Gynecology

## 2024-08-13 ENCOUNTER — Other Ambulatory Visit: Payer: Self-pay | Admitting: Certified Nurse Midwife

## 2024-08-13 ENCOUNTER — Other Ambulatory Visit: Payer: Self-pay

## 2024-08-13 ENCOUNTER — Encounter: Payer: Self-pay | Admitting: Obstetrics and Gynecology

## 2024-08-13 VITALS — BP 131/83 | HR 74 | Wt 250.3 lb

## 2024-08-13 DIAGNOSIS — Z3401 Encounter for supervision of normal first pregnancy, first trimester: Secondary | ICD-10-CM | POA: Insufficient documentation

## 2024-08-13 DIAGNOSIS — Z31430 Encounter of female for testing for genetic disease carrier status for procreative management: Secondary | ICD-10-CM | POA: Diagnosis not present

## 2024-08-13 DIAGNOSIS — Z1332 Encounter for screening for maternal depression: Secondary | ICD-10-CM

## 2024-08-13 DIAGNOSIS — Z3490 Encounter for supervision of normal pregnancy, unspecified, unspecified trimester: Secondary | ICD-10-CM

## 2024-08-13 DIAGNOSIS — Z3A11 11 weeks gestation of pregnancy: Secondary | ICD-10-CM

## 2024-08-13 DIAGNOSIS — Z3687 Encounter for antenatal screening for uncertain dates: Secondary | ICD-10-CM

## 2024-08-13 DIAGNOSIS — Z8709 Personal history of other diseases of the respiratory system: Secondary | ICD-10-CM

## 2024-08-13 DIAGNOSIS — O219 Vomiting of pregnancy, unspecified: Secondary | ICD-10-CM

## 2024-08-13 MED ORDER — ALBUTEROL SULFATE HFA 108 (90 BASE) MCG/ACT IN AERS: 2.0000 | INHALATION_SPRAY | RESPIRATORY_TRACT | 2 refills | Status: AC | PRN

## 2024-08-13 MED ORDER — ASPIRIN 81 MG PO TBEC: 81.0000 mg | DELAYED_RELEASE_TABLET | Freq: Every day | ORAL | 2 refills | Status: AC

## 2024-08-13 NOTE — Progress Notes (Signed)
 INITIAL PRENATAL VISIT  Subjective:   Hannah Meyer is being seen today for her first obstetrical visit.  She is at [redacted]w[redacted]d gestation by ultrasound Her obstetrical history is significant for none.  Patient formula  intend to breast feed. Pregnancy history fully reviewed.  Patient reports no complaints.  Indications for ASA therapy (per uptodate) Two or more of the following: Nulliparity Yes Obesity (body mass index >30 kg/m2) Yes Family history of preeclampsia in mother or sister No Age >=35 years No Sociodemographic characteristics (African American race, low socioeconomic level) Yes Personal risk factors (eg, previous pregnancy with low birth weight or small for gestational age infant, previous adverse pregnancy outcome [eg, stillbirth], interval >10 years between pregnancies) No  Objective:    Obstetric History OB History  Gravida Para Term Preterm AB Living  1 0 0 0 0 0  SAB IAB Ectopic Multiple Live Births  0 0 0 0     # Outcome Date GA Lbr Len/2nd Weight Sex Type Anes PTL Lv  1 Current             Past Medical History:  Diagnosis Date   Asthma     History reviewed. No pertinent surgical history.  Current Outpatient Medications on File Prior to Visit  Medication Sig Dispense Refill   prenatal vitamin w/FE, FA (PRENATAL 1 + 1) 27-1 MG TABS tablet Take 1 tablet by mouth daily at 12 noon. 30 tablet 11   promethazine  (PHENERGAN ) 25 MG tablet Take 1 tablet (25 mg total) by mouth every 6 (six) hours as needed for nausea or vomiting. 30 tablet 0   acetaminophen  (TYLENOL ) 325 MG tablet Take 2 tablets (650 mg total) by mouth every 6 (six) hours as needed for fever. (Patient not taking: Reported on 08/13/2024) 30 tablet 0   albuterol  (VENTOLIN  HFA) 108 (90 Base) MCG/ACT inhaler Inhale 2 puffs into the lungs every 4 (four) hours as needed for wheezing or shortness of breath. (Patient not taking: Reported on 08/13/2024) 18 g 2   amoxicillin -clavulanate (AUGMENTIN ) 875-125 MG  tablet Take 1 tablet by mouth every 12 (twelve) hours. (Patient not taking: Reported on 08/13/2024) 20 tablet 0   Blood Pressure Monitoring DEVI 1 each by Does not apply route once a week. (Patient not taking: Reported on 08/13/2024) 1 each 0   lidocaine  (LIDODERM ) 5 % Place 1 patch onto the skin daily. Remove & Discard patch within 12 hours or as directed by MD (Patient not taking: Reported on 08/13/2024) 30 patch 0   magic mouthwash (lidocaine , diphenhydrAMINE, alum & mag hydroxide) suspension Swish and spit 5 mLs 3 (three) times daily. (Patient not taking: Reported on 08/13/2024) 360 mL 0   No current facility-administered medications on file prior to visit.    No Known Allergies  Social History:  reports that she is a non-smoker but has been exposed to tobacco smoke. She has never used smokeless tobacco. She reports that she does not currently use drugs after having used the following drugs: Marijuana. She reports that she does not drink alcohol.  Family History  Problem Relation Age of Onset   Healthy Mother    Healthy Father     The following portions of the patient's history were reviewed and updated as appropriate: allergies, current medications, past family history, past medical history, past social history, past surgical history and problem list.  Review of Systems Review of Systems  All other systems reviewed and are negative.   Physical Exam:  BP 131/83  Pulse 74   Wt 250 lb 4.8 oz (113.5 kg)   LMP 06/01/2024   BMI 41.65 kg/m  CONSTITUTIONAL: Well-developed, well-nourished female in no acute distress.  HENT:  Normocephalic, atraumatic.   NECK: Normal range of motion SKIN: Skin is warm and dry. MUSCULOSKELETAL: Normal range of motion NEUROLOGIC: Alert and oriented  PSYCHIATRIC: Normal mood and affect. Normal behavior.  RESPIRATORY: normal effort ABDOMEN: Soft PELVIC:deferred    Assessment:    Pregnancy: G1P0000  1. Encounter for supervision of normal first pregnancy  in first trimester (Primary) Viability scan, viable IUP, dating based on US  Discussed recommendation ASA, rx sent Desires pap nv  - Hemoglobin A1c - CBC/D/Plt+RPR+Rh+ABO+RubIgG... - Culture, OB Urine - GC/Chlamydia probe amp ([place])not at [facility] - aspirin  EC 81 MG tablet; Take 1 tablet (81 mg total) by mouth daily. Start taking when you are [redacted] weeks pregnant for rest of pregnancy for prevention of preeclampsia  Dispense: 300 tablet; Refill: 2 - US  MFM OB DETAIL +14 WK; Future - Interpretation: - Urine Culture, OB Reflex  2. Encounter of female for testing for genetic disease carrier status for procreative management  - HORIZON Basic Panel  3. [redacted] weeks gestation of pregnancy  - PANORAMA PRENATAL TEST  4. History of asthma Refill sent  - albuterol  (VENTOLIN  HFA) 108 (90 Base) MCG/ACT inhaler; Inhale 2 puffs into the lungs every 4 (four) hours as needed for wheezing or shortness of breath.  Dispense: 18 g; Refill: 2    Plan:     Initial labs drawn. Prenatal vitamins. Problem list reviewed and updated. Reviewed in detail the nature of the practice with collaborative care between  Genetic screening discussed: NIPS/First trimester screen/Quad/AFP ordered. Role of ultrasound in pregnancy discussed; Anatomy US : ordered.  Discussed clinic routines, schedule of care and testing, genetic screening options, involvement of students and residents under the direct supervision of APPs and doctors and presence of female providers. Pt verbalized understanding.   Delores Nidia CROME, FNP

## 2024-08-14 ENCOUNTER — Ambulatory Visit: Payer: Self-pay | Admitting: Obstetrics and Gynecology

## 2024-08-14 DIAGNOSIS — D563 Thalassemia minor: Secondary | ICD-10-CM

## 2024-08-14 LAB — CBC/D/PLT+RPR+RH+ABO+RUBIGG...
Antibody Screen: NEGATIVE
Basophils Absolute: 0 x10E3/uL (ref 0.0–0.2)
Basos: 0 %
EOS (ABSOLUTE): 0.1 x10E3/uL (ref 0.0–0.4)
Eos: 1 %
HCV Ab: NONREACTIVE
HIV Screen 4th Generation wRfx: NONREACTIVE
Hematocrit: 39.1 % (ref 34.0–46.6)
Hemoglobin: 12.6 g/dL (ref 11.1–15.9)
Hepatitis B Surface Ag: NEGATIVE
Immature Grans (Abs): 0 x10E3/uL (ref 0.0–0.1)
Immature Granulocytes: 0 %
Lymphocytes Absolute: 2.4 x10E3/uL (ref 0.7–3.1)
Lymphs: 22 %
MCH: 28.3 pg (ref 26.6–33.0)
MCHC: 32.2 g/dL (ref 31.5–35.7)
MCV: 88 fL (ref 79–97)
Monocytes Absolute: 0.9 x10E3/uL (ref 0.1–0.9)
Monocytes: 8 %
Neutrophils Absolute: 7.6 x10E3/uL — ABNORMAL HIGH (ref 1.4–7.0)
Neutrophils: 69 %
Platelets: 324 x10E3/uL (ref 150–450)
RBC: 4.46 x10E6/uL (ref 3.77–5.28)
RDW: 13.1 % (ref 11.7–15.4)
RPR Ser Ql: NONREACTIVE
Rh Factor: POSITIVE
Rubella Antibodies, IGG: 1.33 {index} (ref >0.99)
WBC: 11 x10E3/uL — ABNORMAL HIGH (ref 3.4–10.8)

## 2024-08-14 LAB — HEMOGLOBIN A1C
Est. average glucose Bld gHb Est-mCnc: 111 mg/dL
Hgb A1c MFr Bld: 5.5 % (ref 4.8–5.6)

## 2024-08-14 LAB — GC/CHLAMYDIA PROBE AMP (~~LOC~~) NOT AT ARMC
Chlamydia: NEGATIVE
Comment: NEGATIVE
Comment: NORMAL
Neisseria Gonorrhea: NEGATIVE

## 2024-08-14 LAB — HCV INTERPRETATION

## 2024-08-15 LAB — URINE CULTURE, OB REFLEX

## 2024-08-15 LAB — CULTURE, OB URINE

## 2024-08-16 DIAGNOSIS — Z8709 Personal history of other diseases of the respiratory system: Secondary | ICD-10-CM | POA: Insufficient documentation

## 2024-08-16 DIAGNOSIS — O99512 Diseases of the respiratory system complicating pregnancy, second trimester: Secondary | ICD-10-CM | POA: Insufficient documentation

## 2024-08-19 LAB — PANORAMA PRENATAL TEST FULL PANEL:PANORAMA TEST PLUS 5 ADDITIONAL MICRODELETIONS: FETAL FRACTION: 5

## 2024-08-23 DIAGNOSIS — D563 Thalassemia minor: Secondary | ICD-10-CM | POA: Insufficient documentation

## 2024-08-23 LAB — HORIZON CUSTOM: REPORT SUMMARY: POSITIVE — AB

## 2024-08-24 NOTE — Telephone Encounter (Addendum)
 RN called pt regarding Horizon results, pt states she was aware from MyChart message sent by NP.  Provided [facility] phone number [phone] for genetic counseling service.    Waddell, RN  ----- Message from Nidia Daring sent at 08/23/2024  6:22 PM EDT ----- Devonna thal carrier, can we coordinate referral to mfm genetics ----- Message ----- From: Rebecka Memos Lab Results In Sent: 08/14/2024   8:38 AM EDT To: Nidia LITTIE Daring, FNP

## 2024-08-27 ENCOUNTER — Telehealth: Payer: Self-pay | Admitting: Family Medicine

## 2024-08-27 NOTE — Telephone Encounter (Signed)
 Patient has not been schedule with mom baby did not answer phone call, need to know if she is still wanting to be a part of mombaby program?

## 2024-09-08 ENCOUNTER — Ambulatory Visit (INDEPENDENT_AMBULATORY_CARE_PROVIDER_SITE_OTHER): Admitting: Obstetrics and Gynecology

## 2024-09-08 ENCOUNTER — Other Ambulatory Visit: Payer: Self-pay

## 2024-09-08 ENCOUNTER — Other Ambulatory Visit (HOSPITAL_COMMUNITY)
Admission: RE | Admit: 2024-09-08 | Discharge: 2024-09-08 | Disposition: A | Discharge status: Home / Self Care | Source: Ambulatory Visit | Attending: Obstetrics and Gynecology | Admitting: Obstetrics and Gynecology

## 2024-09-08 VITALS — BP 116/79 | HR 81 | Wt 247.0 lb

## 2024-09-08 DIAGNOSIS — Z3A15 15 weeks gestation of pregnancy: Secondary | ICD-10-CM | POA: Diagnosis not present

## 2024-09-08 DIAGNOSIS — Z3402 Encounter for supervision of normal first pregnancy, second trimester: Secondary | ICD-10-CM | POA: Diagnosis present

## 2024-09-08 DIAGNOSIS — D563 Thalassemia minor: Secondary | ICD-10-CM

## 2024-09-08 DIAGNOSIS — O99612 Diseases of the digestive system complicating pregnancy, second trimester: Secondary | ICD-10-CM | POA: Diagnosis not present

## 2024-09-08 DIAGNOSIS — K5900 Constipation, unspecified: Secondary | ICD-10-CM

## 2024-09-08 DIAGNOSIS — Z8709 Personal history of other diseases of the respiratory system: Secondary | ICD-10-CM

## 2024-09-08 MED ORDER — DOCUSATE SODIUM 100 MG PO CAPS
100.0000 mg | ORAL_CAPSULE | Freq: Two times a day (BID) | ORAL | 2 refills | Status: AC | PRN
Start: 2024-09-08 — End: ?

## 2024-09-08 NOTE — Progress Notes (Signed)
 Unable to reach MFM to schedule US . Message sent to clerical pool to have US  schedule for patient.   B'Aisha, RMA

## 2024-09-08 NOTE — Progress Notes (Signed)
   PRENATAL VISIT NOTE  Subjective:  Hannah Meyer is a 28 y.o. G1P0000 at [redacted]w[redacted]d being seen today for ongoing prenatal care.  She is currently monitored for the following issues for this low-risk pregnancy and has Encounter for supervision of normal first pregnancy in second trimester; History of asthma; and Alpha thalassemia silent carrier on their problem list.  Patient doing well with no acute concerns today. She reports constipation.   . Vag. Bleeding: None.  Movement: Present. Denies leaking of fluid.   The following portions of the patient's history were reviewed and updated as appropriate: allergies, current medications, past family history, past medical history, past social history, past surgical history and problem list. Problem list updated.  Objective:   Vitals:   09/08/24 1326  BP: 116/79  Pulse: 81  Weight: 247 lb (112 kg)    Fetal Status: Fetal Heart Rate (bpm): 150 (Simultaneous filing. User may not have seen previous data.)   Movement: Present     General:  Alert, oriented and cooperative. Patient is in no acute distress.  Skin: Skin is warm and dry. No rash noted.   Cardiovascular: Normal heart rate noted  Respiratory: Normal respiratory effort, no problems with respiration noted  Abdomen: Soft, gravid, appropriate for gestational age.  Pain/Pressure: Absent     Pelvic: Cervical exam deferred        Extremities: Normal range of motion.  Edema: None  Mental Status:  Normal mood and affect. Normal behavior. Normal judgment and thought content.   Assessment and Plan:  Pregnancy: G1P0000 at [redacted]w[redacted]d  1. [redacted] weeks gestation of pregnancy (Primary)   2. Encounter for supervision of normal first pregnancy in second trimester Continue routine prenatal care  - AFP, Serum, Open Spina Bifida - Cytology - PAP  3. History of asthma Pt doing well with albuterol  inhaler  4. Alpha thalassemia silent carrier   5. Constipation during pregnancy in second  trimester Advised increased hydration, rx for colace, patient can take OTC miralax if needed - docusate sodium (COLACE) 100 MG capsule; Take 1 capsule (100 mg total) by mouth 2 (two) times daily as needed.  Dispense: 30 capsule; Refill: 2  Preterm labor symptoms and general obstetric precautions including but not limited to vaginal bleeding, contractions, leaking of fluid and fetal movement were reviewed in detail with the patient.  Please refer to After Visit Summary for other counseling recommendations.   Return in about 4 weeks (around 10/06/2024) for LOB, in person.   Jerilynn Buddle, MD Faculty Attending Center for [facility]

## 2024-09-09 ENCOUNTER — Ambulatory Visit: Payer: Self-pay | Admitting: Obstetrics and Gynecology

## 2024-09-09 LAB — CYTOLOGY - PAP
Chlamydia: NEGATIVE
Comment: NEGATIVE
Comment: NORMAL
Diagnosis: NEGATIVE
Neisseria Gonorrhea: NEGATIVE

## 2024-09-10 LAB — AFP, SERUM, OPEN SPINA BIFIDA
AFP MoM: 1.34
AFP Value: 32.3 ng/mL
Gest. Age on Collection Date: 15.2 wk
Maternal Age At EDD: 26.3 a
OSBR Risk 1 IN: 8546
Test Results:: NEGATIVE
Weight: 247 [lb_av]

## 2024-09-15 ENCOUNTER — Ambulatory Visit

## 2024-10-08 ENCOUNTER — Ambulatory Visit (INDEPENDENT_AMBULATORY_CARE_PROVIDER_SITE_OTHER): Admitting: Family Medicine

## 2024-10-08 ENCOUNTER — Encounter: Admitting: Obstetrics & Gynecology

## 2024-10-08 ENCOUNTER — Other Ambulatory Visit: Payer: Self-pay

## 2024-10-08 ENCOUNTER — Encounter: Payer: Self-pay | Admitting: Family Medicine

## 2024-10-08 VITALS — BP 122/74 | HR 82 | Wt 255.2 lb

## 2024-10-08 DIAGNOSIS — Z8709 Personal history of other diseases of the respiratory system: Secondary | ICD-10-CM | POA: Diagnosis not present

## 2024-10-08 DIAGNOSIS — Z3402 Encounter for supervision of normal first pregnancy, second trimester: Secondary | ICD-10-CM

## 2024-10-08 DIAGNOSIS — D563 Thalassemia minor: Secondary | ICD-10-CM

## 2024-10-08 NOTE — Patient Instructions (Signed)

## 2024-10-08 NOTE — Progress Notes (Signed)
   Subjective:  Hannah Meyer is a 28 y.o. G1P0000 at [redacted]w[redacted]d being seen today for ongoing prenatal care.  She is currently monitored for the following issues for this low-risk pregnancy and has Encounter for supervision of normal first pregnancy in second trimester; History of asthma; and Alpha thalassemia silent carrier on their problem list.  Patient reports no complaints.  Contractions: Not present. Vag. Bleeding: None.  Movement: Absent. Denies leaking of fluid.   The following portions of the patient's history were reviewed and updated as appropriate: allergies, current medications, past family history, past medical history, past social history, past surgical history and problem list. Problem list updated.  Objective:   Vitals:   10/08/24 1544  BP: 122/74  Pulse: 82  Weight: 255 lb 3.2 oz (115.8 kg)    Fetal Status: Fetal Heart Rate (bpm): 135   Movement: Absent     General:  Alert, oriented and cooperative. Patient is in no acute distress.  Skin: Skin is warm and dry. No rash noted.   Cardiovascular: Normal heart rate noted  Respiratory: Normal respiratory effort, no problems with respiration noted  Abdomen: Soft, gravid, appropriate for gestational age. Pain/Pressure: Absent     Pelvic: Vag. Bleeding: None     Cervical exam deferred        Extremities: Normal range of motion.  Edema: None  Mental Status: Normal mood and affect. Normal behavior. Normal judgment and thought content.   Urinalysis:      Assessment and Plan:  Pregnancy: G1P0000 at [redacted]w[redacted]d  1. Encounter for supervision of normal first pregnancy in second trimester (Primary) BP and FHR normal Up to date on labs Offered flu vaccine, accepts  2. History of asthma Last used albuterol  last week Discussed if using more than twice a week to let us  know so we can start steroid inhaler  3. Alpha thalassemia silent carrier Discussed implications, given partner kit  Preterm labor symptoms and general obstetric  precautions including but not limited to vaginal bleeding, contractions, leaking of fluid and fetal movement were reviewed in detail with the patient. Please refer to After Visit Summary for other counseling recommendations.  Return in about 4 weeks (around 11/05/2024) for [place] patient, ob visit.   Lola Donnice HERO, MD

## 2024-11-02 ENCOUNTER — Other Ambulatory Visit: Payer: Self-pay

## 2024-11-02 ENCOUNTER — Ambulatory Visit: Admitting: Family Medicine

## 2024-11-02 VITALS — BP 120/81 | HR 75 | Wt 254.5 lb

## 2024-11-02 DIAGNOSIS — Z3402 Encounter for supervision of normal first pregnancy, second trimester: Secondary | ICD-10-CM

## 2024-11-02 DIAGNOSIS — Z3A23 23 weeks gestation of pregnancy: Secondary | ICD-10-CM

## 2024-11-02 DIAGNOSIS — Z8709 Personal history of other diseases of the respiratory system: Secondary | ICD-10-CM

## 2024-11-02 NOTE — Progress Notes (Signed)
 PRENATAL VISIT NOTE  Subjective:  Hannah Meyer is a 28 y.o. G1P0000 at [redacted]w[redacted]d being seen today for ongoing prenatal care.  She is currently monitored for the following issues for this low-risk pregnancy and has Encounter for supervision of normal first pregnancy in second trimester; History of asthma; and Alpha thalassemia silent carrier on their problem list.  Patient reports no complaints.  Contractions: Not present. Vag. Bleeding: None.  Movement: Present. Denies leaking of fluid.   The following portions of the patient's history were reviewed and updated as appropriate: allergies, current medications, past family history, past medical history, past social history, past surgical history and problem list.   Objective:   Vitals:   11/02/24 1339  BP: 120/81  Pulse: 75  Weight: 254 lb 8 oz (115.4 kg)    Fetal Status:  Fetal Heart Rate (bpm): 130 Fundal Height: 24 cm Movement: Present    General: Alert, oriented and cooperative. Patient is in no acute distress.  Skin: Skin is warm and dry. No rash noted.   Cardiovascular: Normal heart rate noted  Respiratory: Normal respiratory effort, no problems with respiration noted  Abdomen: Soft, gravid, appropriate for gestational age.  Pain/Pressure: Absent     Pelvic: Cervical exam deferred        Extremities: Normal range of motion.  Edema: None  Mental Status: Normal mood and affect. Normal behavior. Normal judgment and thought content.      08/13/2024    4:51 PM  Depression screen PHQ 2/9  Decreased Interest 3  Down, Depressed, Hopeless 1  PHQ - 2 Score 4  Altered sleeping 3  Tired, decreased energy 3  Change in appetite 3  Feeling bad or failure about yourself  1  Trouble concentrating 0  Moving slowly or fidgety/restless 2  Suicidal thoughts 0  PHQ-9 Score 16      Data saved with a previous flowsheet row definition        08/13/2024    4:51 PM  GAD 7 : Generalized Anxiety Score  Nervous, Anxious, on Edge 2   Control/stop worrying 1  Worry too much - different things 3  Trouble relaxing 3  Restless 0  Easily annoyed or irritable 2  Afraid - awful might happen 1  Total GAD 7 Score 12    Assessment and Plan:  Pregnancy: G1P0000 at [redacted]w[redacted]d 1. Encounter for supervision of normal first pregnancy in second trimester (Primary) Up to date Vigorous movement FH appropriate Concerns today: None Reviewed upcoming lab visit for 3rd trimester labs -- needs AM appointment for lab  2. History of asthma No SOB today  3. [redacted] weeks gestation of pregnancy   Preterm labor symptoms and general obstetric precautions including but not limited to vaginal bleeding, contractions, leaking of fluid and fetal movement were reviewed in detail with the patient. Please refer to After Visit Summary for other counseling recommendations.   Return in about 4 weeks (around 11/30/2024) for Mom+Baby Combined Care, 28 wk labs, GTT.  Future Appointments  Date Time Provider Department Center  11/10/2024  8:30 AM [facility] US1 [facility]  11/30/2024  3:15 PM Eldonna Suzen Octave, MD [facility]  12/14/2024  3:15 PM Ilean Norleen GAILS, MD [facility]  12/28/2024  3:15 PM [facility] MOM BABY DYAD PROVIDER [facility]  01/12/2025  3:15 PM [facility] MOM BABY DYAD PROVIDER [facility]  01/26/2025  3:15 PM [facility] MOM BABY DYAD PROVIDER [facility]  02/02/2025  3:15 PM [facility] MOM BABY DYAD PROVIDER [facility]  02/09/2025  3:15 PM [facility] MOM BABY DYAD PROVIDER [facility]  02/16/2025  3:15 PM [facility] MOM BABY DYAD PROVIDER [facility]  02/23/2025  3:15 PM [facility] MOM BABY DYAD PROVIDER [facility]    Suzen Maryan Masters, MD

## 2024-11-03 ENCOUNTER — Encounter: Admitting: Obstetrics and Gynecology

## 2024-11-09 DIAGNOSIS — O99210 Obesity complicating pregnancy, unspecified trimester: Secondary | ICD-10-CM | POA: Insufficient documentation

## 2024-11-10 ENCOUNTER — Ambulatory Visit: Attending: Obstetrics and Gynecology

## 2024-11-10 ENCOUNTER — Ambulatory Visit: Admitting: Maternal & Fetal Medicine

## 2024-11-10 VITALS — BP 138/75 | HR 83

## 2024-11-10 DIAGNOSIS — E66813 Obesity, class 3: Secondary | ICD-10-CM

## 2024-11-10 DIAGNOSIS — Z3402 Encounter for supervision of normal first pregnancy, second trimester: Secondary | ICD-10-CM

## 2024-11-10 DIAGNOSIS — Z3A24 24 weeks gestation of pregnancy: Secondary | ICD-10-CM

## 2024-11-10 DIAGNOSIS — Z3401 Encounter for supervision of normal first pregnancy, first trimester: Secondary | ICD-10-CM | POA: Insufficient documentation

## 2024-11-10 DIAGNOSIS — O99210 Obesity complicating pregnancy, unspecified trimester: Secondary | ICD-10-CM | POA: Insufficient documentation

## 2024-11-10 DIAGNOSIS — O99512 Diseases of the respiratory system complicating pregnancy, second trimester: Secondary | ICD-10-CM | POA: Diagnosis not present

## 2024-11-10 DIAGNOSIS — J45909 Unspecified asthma, uncomplicated: Secondary | ICD-10-CM | POA: Diagnosis not present

## 2024-11-10 DIAGNOSIS — O99212 Obesity complicating pregnancy, second trimester: Secondary | ICD-10-CM

## 2024-11-10 NOTE — Progress Notes (Signed)
 Patient information  Patient Name: Hannah Meyer  Patient MRN:   [id number]  Referring practice: MFM Referring Provider: [facility])  Problem List   Patient Active Problem List   Diagnosis Date Noted   Obesity affecting pregnancy 11/09/2024   Alpha thalassemia silent carrier 08/23/2024   History of asthma 08/16/2024   Supervision of high risk pregnancy 08/05/2024    Maternal Fetal Medicine Consult Hannah Meyer is a 28 y.o. G1P0000 at [redacted]w[redacted]d here for ultrasound and consultation. She has no acute concerns.   Today we focused on the following:   The patient is evaluated today at 24 weeks and 2 days for a pregnancy complicated by class III obesity (pre-gravid BMI 40) and a history of well-controlled asthma managed with only occasional albuterol  use. We reviewed the obstetric implications of maternal obesity, including increased risks of gestational diabetes, hypertensive disorders, fetal growth abnormalities, cesarean delivery, and anesthesia-related complications and rarely stillbirth. I emphasized the importance of appropriate gestational weight gain of 10-20 lbs.   Her asthma is currently well controlled; we discussed that poorly controlled asthma may increase risks of preeclampsia, fetal growth restriction, and preterm birth, and that maintaining consistent control and avoiding triggers is essential.  I discussed the importance of getting a peak flow meter as well as notifying her OB provider with any concerns of exacerbation.  She had low-risk aneuploidy screening for a female fetus, a negative AFP, and is known to be an alpha-thalassemia silent carrier; we reviewed that the silent carrier state generally carries no obstetric or neonatal complications when the partner is not a carrier and that her screening results are reassuring. All counseling points and questions were addressed.  Today's ultrasound shows a normal estimated fetal weight in the 31st  percentile with normal amniotic fluid.  Recommendations -After review of the patients LMP, menstrual cycle history and any prior US , the EDD should be Estimated Date of Delivery: 03/01/25.  -Aneuploidy screening was completed at the [facility] office. -Anatomy ultrasound was done today with the above findings (see report). -Aspirin  81-162 mg continued throughout the pregnancy for preeclampsia prophylaxis. -Baseline labs: CMP, CBC, urine protein creatinine ratio if not previously completed. Repeat with any concerns for preeclampsia. -Blood pressure goal of < 140 systolic and < 90 diastolic. Antihypertensive medication should be added/adjusted until BP goal is achieved.  -Early Glucola screening (or A1c of patient declines). -Serial growth ultrasounds every 4 weeks starting at 24 to 28 weeks until delivery. -Antenatal testing (usually weekly BPP or NST) weekly at 32 weeks until delivery. -Continue to monitor asthma.  Use albuterol  as needed.  If ever using more than twice a week or ever at night then she should notify her doctor and start an inhaled corticosteroid. -Delivery likely around [redacted] weeks gestation or sooner if indicated.  Review of Systems: A review of systems was performed and was negative except per HPI   Past Obstetrical History:  OB History  Gravida Para Term Preterm AB Living  1 0 0 0 0 0  SAB IAB Ectopic Multiple Live Births  0 0 0 0     # Outcome Date GA Lbr Len/2nd Weight Sex Type Anes PTL Lv  1 Current              Past Medical History:  Past Medical History:  Diagnosis Date   Asthma      Past Surgical History:   No past surgical history on file.   Home Medications:  Current Outpatient Medications on File Prior to Visit  Medication Sig Dispense Refill   acetaminophen  (TYLENOL ) 325 MG tablet Take 2 tablets (650 mg total) by mouth every 6 (six) hours as needed for fever. (Patient not taking: Reported on 09/08/2024) 30 tablet 0   albuterol  (VENTOLIN  HFA) 108 (90 Base)  MCG/ACT inhaler Inhale 2 puffs into the lungs every 4 (four) hours as needed for wheezing or shortness of breath. 18 g 2   aspirin  EC 81 MG tablet Take 1 tablet (81 mg total) by mouth daily. Start taking when you are [redacted] weeks pregnant for rest of pregnancy for prevention of preeclampsia 300 tablet 2   Blood Pressure Monitoring DEVI 1 each by Does not apply route once a week. (Patient not taking: Reported on 08/13/2024) 1 each 0   docusate sodium  (COLACE) 100 MG capsule Take 1 capsule (100 mg total) by mouth 2 (two) times daily as needed. 30 capsule 2   prenatal vitamin w/FE, FA (PRENATAL 1 + 1) 27-1 MG TABS tablet Take 1 tablet by mouth daily at 12 noon. 30 tablet 11   promethazine  (PHENERGAN ) 25 MG tablet Take 1 tablet (25 mg total) by mouth every 6 (six) hours as needed for nausea or vomiting. 30 tablet 0   No current facility-administered medications on file prior to visit.      Allergies:   No Known Allergies   Physical Exam:   There were no vitals filed for this visit. Sitting comfortably on the sonogram table Nonlabored breathing Normal rate and rhythm Abdomen is nontender  Thank you for the opportunity to be involved with this patient's care. Please let us  know if we can be of any further assistance.   60 minutes of time was spent reviewing the patient's chart including labs, imaging and documentation.  At least 50% of this time was spent with direct patient care discussing the diagnosis, management and prognosis of her care.  Delora Smaller MFM, [facility]   11/10/2024  3:45 PM

## 2024-11-17 DIAGNOSIS — J029 Acute pharyngitis, unspecified: Secondary | ICD-10-CM | POA: Diagnosis not present

## 2024-11-17 DIAGNOSIS — H9201 Otalgia, right ear: Secondary | ICD-10-CM | POA: Diagnosis present

## 2024-11-17 DIAGNOSIS — Z5321 Procedure and treatment not carried out due to patient leaving prior to being seen by health care provider: Secondary | ICD-10-CM | POA: Diagnosis not present

## 2024-11-18 ENCOUNTER — Other Ambulatory Visit: Payer: Self-pay

## 2024-11-18 ENCOUNTER — Emergency Department (HOSPITAL_COMMUNITY)
Admission: EM | Admit: 2024-11-18 | Discharge: 2024-11-18 | Disposition: A | Discharge status: Home / Self Care | Attending: Emergency Medicine | Admitting: Emergency Medicine

## 2024-11-18 ENCOUNTER — Emergency Department (HOSPITAL_COMMUNITY)
Admission: EM | Admit: 2024-11-18 | Discharge: 2024-11-18 | Attending: Emergency Medicine | Admitting: Emergency Medicine

## 2024-11-18 ENCOUNTER — Emergency Department (HOSPITAL_COMMUNITY)

## 2024-11-18 ENCOUNTER — Encounter (HOSPITAL_COMMUNITY): Payer: Self-pay | Admitting: Emergency Medicine

## 2024-11-18 DIAGNOSIS — O99891 Other specified diseases and conditions complicating pregnancy: Secondary | ICD-10-CM | POA: Insufficient documentation

## 2024-11-18 DIAGNOSIS — Z7982 Long term (current) use of aspirin: Secondary | ICD-10-CM | POA: Insufficient documentation

## 2024-11-18 DIAGNOSIS — H7291 Unspecified perforation of tympanic membrane, right ear: Secondary | ICD-10-CM | POA: Insufficient documentation

## 2024-11-18 DIAGNOSIS — H9201 Otalgia, right ear: Secondary | ICD-10-CM | POA: Diagnosis not present

## 2024-11-18 DIAGNOSIS — Z3A25 25 weeks gestation of pregnancy: Secondary | ICD-10-CM | POA: Insufficient documentation

## 2024-11-18 LAB — GROUP A STREP BY PCR: Group A Strep by PCR: NOT DETECTED

## 2024-11-18 MED ORDER — CIPROFLOXACIN-DEXAMETHASONE 0.3-0.1 % OT SUSP: 4.0000 [drp] | Freq: Two times a day (BID) | OTIC | 0 refills | Status: AC

## 2024-11-18 MED ORDER — AMOXICILLIN-POT CLAVULANATE 875-125 MG PO TABS: 1.0000 | ORAL_TABLET | Freq: Two times a day (BID) | ORAL | 0 refills | Status: AC

## 2024-11-18 NOTE — ED Triage Notes (Addendum)
 Patient here w/ an earache to the R ear x3 days. Denies fevers, chills, cough, congestion, runny nose. Reports some throat irritation.

## 2024-11-18 NOTE — Discharge Instructions (Addendum)
 You were seen in the ER today for evaluation of your ear pain.  You have ruptured your eardrum likely from your Q-tip use.  It is very important that you discontinue use of Q-tips now and into the future.  You will need to follow-up with an ear nose and throat doctor for this.  I have included information for Dr. Tobie into the discharge paperwork.  You need to call to schedule follow-up within the next week with his office.  I am sending you home on two medications.  There will be an oral medication and eardrops.  You will take these as prescribed for the next 7 days.  I would avoid swimming or submerging your head in any water.  Is still okay to shower.  I included additional information into the discharge report for you to review.  If you have any concerns, new or worsening symptoms, please return your nearest emergency department for reevaluation.  Contact a health care provider if: You have a fever. You have ear pain. You have mucus or blood draining from your ear. You have hearing loss, dizziness, or ringing in your ear. Get help right away if: You have sudden hearing loss. You are very dizzy. You have severe ear pain. Your face feels weak or becomes limp (paralyzed). These symptoms may represent a serious problem that is an emergency. Do not wait to see if the symptoms will go away. Get medical help right away. Call your local emergency services (911 in the [place] Do not drive yourself to the hospital.

## 2024-11-18 NOTE — ED Triage Notes (Signed)
 Pt. Stated, I've had ear pain since Sunday night

## 2024-11-18 NOTE — ED Provider Notes (Signed)
 Hannah Meyer EMERGENCY DEPARTMENT AT [facility] Provider Note   CSN: [id number] Arrival date & time: 11/18/24  1007     Patient presents with: Otalgia   Hannah Meyer is a 28 y.o. female currently 25w pregnant presents to the ER today for evaluation of right ear pain since Sunday.  Patient reports that she was using a Q-tip and outside and started to have pain in her ear.  Reports that it is having some drainage to it but not bloody drainage.  No fevers, runny nose, nasal congestion, dizziness, lightheadedness, neck stiffness, or facial swelling noted.  She reports total hearing loss out of her right side.  She has never had this happen to her before.  Denies any surgeries to her ears or head.  Denies any other trauma than the Q-tip.  Otalgia Associated symptoms: no congestion, no fever, no neck pain, no rhinorrhea and no sore throat        Prior to Admission medications   Medication Sig Start Date End Date Taking? Authorizing Provider  amoxicillin -clavulanate (AUGMENTIN ) 875-125 MG tablet Take 1 tablet by mouth every 12 (twelve) hours. 11/18/24  Yes Bernis Ernst, PA-C  ciprofloxacin-dexamethasone  (CIPRODEX) OTIC suspension Place 4 drops into the right ear 2 (two) times daily for 7 days. 11/18/24 11/25/24 Yes Bernis Ernst, PA-C  acetaminophen  (TYLENOL ) 325 MG tablet Take 2 tablets (650 mg total) by mouth every 6 (six) hours as needed for fever. Patient not taking: Reported on 09/08/2024 09/21/15   Desiderio Chew, PA-C  albuterol  (VENTOLIN  HFA) 108 (90 Base) MCG/ACT inhaler Inhale 2 puffs into the lungs every 4 (four) hours as needed for wheezing or shortness of breath. 08/13/24   Delores Nidia CROME, FNP  aspirin  EC 81 MG tablet Take 1 tablet (81 mg total) by mouth daily. Start taking when you are [redacted] weeks pregnant for rest of pregnancy for prevention of preeclampsia 08/13/24   Delores Nidia CROME, FNP  Blood Pressure Monitoring DEVI 1 each by Does not apply route once a  week. Patient not taking: Reported on 08/13/2024 08/05/24   Delores Nidia CROME, FNP  docusate sodium  (COLACE) 100 MG capsule Take 1 capsule (100 mg total) by mouth 2 (two) times daily as needed. 09/08/24   Zina Jerilynn LABOR, MD  prenatal vitamin w/FE, FA (PRENATAL 1 + 1) 27-1 MG TABS tablet Take 1 tablet by mouth daily at 12 noon. 08/05/24   Delores Nidia CROME, FNP  promethazine  (PHENERGAN ) 25 MG tablet Take 1 tablet (25 mg total) by mouth every 6 (six) hours as needed for nausea or vomiting. 07/22/24   Vannie Cornell SAUNDERS, CNM    Allergies: Patient has no known allergies.    Review of Systems  Constitutional:  Negative for chills and fever.  HENT:  Positive for ear pain. Negative for congestion, rhinorrhea and sore throat.   Musculoskeletal:  Negative for neck pain and neck stiffness.  Neurological:  Negative for dizziness and light-headedness.    Updated Vital Signs BP 121/70 (BP Location: Right Arm)   Pulse 88   Temp 98.3 F (36.8 C)   Resp 20   LMP 06/01/2024   SpO2 99%   Physical Exam Vitals and nursing note reviewed.  Constitutional:      General: She is not in acute distress.    Appearance: She is not ill-appearing or toxic-appearing.  HENT:     Right Ear: Tympanic membrane is perforated.     Left Ear: Tympanic membrane, ear canal and external ear normal.  Ears:     Weber exam findings: Lateralizes right.    Comments: Large perforation to the R TM with some macerated skin tissue present int he external ear canal. No bleeding noted. No tenderness to the mastoid, but does have tenderness to manipulation of the pinna. Valery lateralizes to the affected side. For the Rinne test, she reports that both were equal.  Eyes:     General: No scleral icterus. Cardiovascular:     Rate and Rhythm: Normal rate.  Musculoskeletal:     Cervical back: Normal range of motion.  Skin:    General: Skin is warm and dry.  Neurological:     General: No focal deficit present.     Mental Status: She  is alert.     Cranial Nerves: No cranial nerve deficit.     Gait: Gait normal.     (all labs ordered are listed, but only abnormal results are displayed) Labs Reviewed - No data to display  EKG: None  Radiology: CT Temporal Bones Wo Contrast Result Date: 11/18/2024 EXAM: CT TEMPORAL BONES WITHOUT CONTRAST 11/18/2024 05:35:32 PM TECHNIQUE: CT of the temporal bones was performed without the administration of intravenous contrast. Multiplanar reformatted images are provided for review. Automated exposure control, iterative reconstruction, and/or weight based adjustment of the mA/kV was utilized to reduce the radiation dose to as low as reasonably achievable. COMPARISON: CT maxillofacial 12/07/2022 CLINICAL HISTORY: Hearing loss, mixed; Perforated TM, complete hearing loss. Per ENT. FINDINGS: RIGHT TEMPORAL BONE: EXTERNAL AUDITORY CANAL: Prominent circumferential soft tissue thickening throughout the external auditory canal without associated bone erosion. MIDDLE EAR CAVITY: Soft tissue thickening along the tympanic membrane. Moderate opacification of the middle ear cavity, greatest in the epitympanum. Opacification of Prussak space. Intact scutum and ossicles. MASTOID AIR CELLS: Mild to moderate mastoid air cell opacification without coalescence. INNER EAR: The cochlea and vestibule are unremarkable. Normal mineralization of the otic capsule. Normal semicircular canals. The vestibular aqueduct is not dilated. INTERNAL AUDITORY CANAL: Unremarkable. Normal bony canal of the facial nerve. LEFT TEMPORAL BONE: EXTERNAL AUDITORY CANAL: Clear. No bony erosion. Scutum is intact. MIDDLE EAR CAVITY: Clear. Ossicular chain is intact. MASTOID AIR CELLS: Clear. INNER EAR: The cochlea and vestibule are unremarkable. Normal mineralization of the otic capsule. Normal semicircular canals. The vestibular aqueduct is not dilated. INTERNAL AUDITORY CANAL: Unremarkable. Normal bony canal of the facial nerve. VASCULAR: Normal  jugular bulbs. Normal carotid canals. BRAIN: Unremarkable. ORBITS: No acute abnormality. SINUSES: Minimal mucosal thickening in the paranasal sinuses. IMPRESSION: 1. Circumferential soft tissue thickening throughout the right external auditory canal and partial opacification of the right middle ear cavity, potentially reflecting otitis externa and otitis media. No bone erosion. 2. Small to moderate amount of right mastoid air cell fluid without coalescence. Electronically signed by: Dasie Hamburg MD 11/18/2024 06:02 PM EST RP Workstation: [id number]    Procedures   Medications Ordered in the ED - No data to display                              Medical Decision Making Amount and/or Complexity of Data Reviewed Radiology: ordered.  Risk Prescription drug management.   28 y.o. female presents to the ER for evaluation of right ear pain with hearing loss. Differential diagnosis includes but is not limited to intracranial process, otitis media, otitis externa, perforated TM. Vital signs  unremarkable. Physical exam as noted above.   Given the large perforation and complete hearing loss, I  consulted ENT and spoke with Dr. Tobie. He recommended doing a CT scan of the temporal bones without contrast to see if there is another cause of her complete right sided hearing loss. Recommended PO Augmentin  and Ciprodex drops.   CT scan shows 1. Circumferential soft tissue thickening throughout the right external auditory canal and partial opacification of the right middle ear cavity, potentially reflecting otitis externa and otitis media. No bone erosion. 2. Small to moderate amount of right mastoid air cell fluid without coalescence. Per radiologist's interpretation.     Contacted, Dr. Tobie with CT results. Plan is still the same with Augmentin  and Ciprodex drops and follow-up in clinic in a week.  Currently patient is amatory without aid gait difficulty.  No cranial nerve deficit appreciated.  She does have some  hearing given that she had a positive Weber and felt that her hearing was the same with both bone and air conductivity.  She has no signs of mastoiditis.  Hemodynamically stable.  I have discussed Ciprodex and Augmentin  with pharmacist, okay to give in pregnancy given no systemic effects of the Cipro or steroid.  Stable for discharge.  We discussed the results of the labs/imaging. The plan is eardrops, oral medications, follow-up with ENT, supportive care measures. We discussed strict return precautions and red flag symptoms. The patient verbalized their understanding and agrees to the plan. The patient is stable and being discharged home in good condition.  Portions of this report may have been transcribed using voice recognition software. Every effort was made to ensure accuracy; however, inadvertent computerized transcription errors may be present.    Final diagnoses:  Perforation of right tympanic membrane    ED Discharge Orders          Ordered    amoxicillin -clavulanate (AUGMENTIN ) 875-125 MG tablet  Every 12 hours        11/18/24 1833    ciprofloxacin-dexamethasone  (CIPRODEX) OTIC suspension  2 times daily        11/18/24 1833               Bernis Ernst, PA-C 11/18/24 1837    Melvenia Motto, MD 11/19/24 [phone]

## 2024-11-18 NOTE — ED Notes (Signed)
 PT left AMA

## 2024-11-21 ENCOUNTER — Telehealth (INDEPENDENT_AMBULATORY_CARE_PROVIDER_SITE_OTHER): Payer: Self-pay | Admitting: Otolaryngology

## 2024-11-21 DIAGNOSIS — H7291 Unspecified perforation of tympanic membrane, right ear: Secondary | ICD-10-CM

## 2024-11-21 NOTE — Telephone Encounter (Signed)
 Ref made to [facility] audio

## 2024-11-23 ENCOUNTER — Other Ambulatory Visit: Payer: Self-pay

## 2024-11-23 DIAGNOSIS — Z3402 Encounter for supervision of normal first pregnancy, second trimester: Secondary | ICD-10-CM

## 2024-11-24 ENCOUNTER — Telehealth (INDEPENDENT_AMBULATORY_CARE_PROVIDER_SITE_OTHER): Payer: Self-pay | Admitting: Otolaryngology

## 2024-11-24 NOTE — Telephone Encounter (Signed)
 Left voice message for patient to call back to schedule ED follow-up with Dr. Tobie.  Patient also needs to call OP Audio to schedule hearing test.

## 2024-11-27 ENCOUNTER — Other Ambulatory Visit: Payer: Self-pay

## 2024-11-27 ENCOUNTER — Other Ambulatory Visit

## 2024-11-27 DIAGNOSIS — Z3402 Encounter for supervision of normal first pregnancy, second trimester: Secondary | ICD-10-CM

## 2024-11-28 LAB — CBC
Hematocrit: 33.9 % — ABNORMAL LOW (ref 34.0–46.6)
Hemoglobin: 10.8 g/dL — ABNORMAL LOW (ref 11.1–15.9)
MCH: 27.8 pg (ref 26.6–33.0)
MCHC: 31.9 g/dL (ref 31.5–35.7)
MCV: 87 fL (ref 79–97)
Platelets: 290 x10E3/uL (ref 150–450)
RBC: 3.89 x10E6/uL (ref 3.77–5.28)
RDW: 13.5 % (ref 11.7–15.4)
WBC: 9 x10E3/uL (ref 3.4–10.8)

## 2024-11-28 LAB — GLUCOSE TOLERANCE, 2 HOURS W/ 1HR
Glucose, 1 hour: 125 mg/dL (ref 70–179)
Glucose, 2 hour: 99 mg/dL (ref 70–152)
Glucose, Fasting: 77 mg/dL (ref 70–91)

## 2024-11-28 LAB — SYPHILIS: RPR W/REFLEX TO RPR TITER AND TREPONEMAL ANTIBODIES, TRADITIONAL SCREENING AND DIAGNOSIS ALGORITHM: RPR Ser Ql: NONREACTIVE

## 2024-11-28 LAB — HIV ANTIBODY (ROUTINE TESTING W REFLEX): HIV Screen 4th Generation wRfx: NONREACTIVE

## 2024-11-30 ENCOUNTER — Other Ambulatory Visit

## 2024-11-30 ENCOUNTER — Encounter: Admitting: Family Medicine

## 2024-11-30 ENCOUNTER — Encounter

## 2024-12-01 ENCOUNTER — Ambulatory Visit: Payer: Self-pay | Admitting: Family Medicine

## 2024-12-01 DIAGNOSIS — Z3402 Encounter for supervision of normal first pregnancy, second trimester: Secondary | ICD-10-CM

## 2024-12-14 ENCOUNTER — Encounter

## 2024-12-14 ENCOUNTER — Other Ambulatory Visit: Payer: Self-pay

## 2024-12-14 ENCOUNTER — Ambulatory Visit: Admitting: Family Medicine

## 2024-12-14 VITALS — BP 112/75 | HR 86 | Wt 264.8 lb

## 2024-12-14 DIAGNOSIS — D563 Thalassemia minor: Secondary | ICD-10-CM | POA: Diagnosis not present

## 2024-12-14 DIAGNOSIS — Z8709 Personal history of other diseases of the respiratory system: Secondary | ICD-10-CM | POA: Diagnosis not present

## 2024-12-14 DIAGNOSIS — Z3A29 29 weeks gestation of pregnancy: Secondary | ICD-10-CM | POA: Diagnosis not present

## 2024-12-14 DIAGNOSIS — Z3402 Encounter for supervision of normal first pregnancy, second trimester: Secondary | ICD-10-CM

## 2024-12-14 DIAGNOSIS — Z3403 Encounter for supervision of normal first pregnancy, third trimester: Secondary | ICD-10-CM

## 2024-12-14 NOTE — Progress Notes (Signed)
 "  PRENATAL VISIT NOTE  Subjective:  Hannah Meyer is a 29 y.o. G1P0000 at [redacted]w[redacted]d being seen today for ongoing prenatal care.  She is currently monitored for the following issues for this low-risk pregnancy and has Supervision of high risk pregnancy; Asthma during pregnancy in second trimester; Alpha thalassemia silent carrier; and Obesity affecting pregnancy on their problem list.  Patient reports no bleeding, no contractions, no cramping, and no leaking.  Contractions: Not present. Vag. Bleeding: None.  Movement: Present. Denies leaking of fluid.   The following portions of the patient's history were reviewed and updated as appropriate: allergies, current medications, past family history, past medical history, past social history, past surgical history and problem list.   Objective:   Vitals:   12/14/24 1524  BP: 112/75  Pulse: 86  Weight: 264 lb 12.8 oz (120.1 kg)    Fetal Status:  Fetal Heart Rate (bpm): 140   Movement: Present    General: Alert, oriented and cooperative. Patient is in no acute distress.  Skin: Skin is warm and dry. No rash noted.   Cardiovascular: Normal heart rate noted  Respiratory: Normal respiratory effort, no problems with respiration noted  Abdomen: Soft, gravid, appropriate for gestational age.  Pain/Pressure: Absent     Pelvic: Cervical exam deferred        Extremities: Normal range of motion.  Edema: None  Mental Status: Normal mood and affect. Normal behavior. Normal judgment and thought content.      08/13/2024    4:51 PM  Depression screen PHQ 2/9  Decreased Interest 3  Down, Depressed, Hopeless 1  PHQ - 2 Score 4  Altered sleeping 3  Tired, decreased energy 3  Change in appetite 3  Feeling bad or failure about yourself  1  Trouble concentrating 0  Moving slowly or fidgety/restless 2  Suicidal thoughts 0  PHQ-9 Score 16      Data saved with a previous flowsheet row definition        08/13/2024    4:51 PM  GAD 7 : Generalized Anxiety  Score  Nervous, Anxious, on Edge 2  Control/stop worrying 1  Worry too much - different things 3  Trouble relaxing 3  Restless 0  Easily annoyed or irritable 2  Afraid - awful might happen 1  Total GAD 7 Score 12    Assessment and Plan:  Pregnancy: G1P0000 at [redacted]w[redacted]d 1. Encounter for supervision of normal first pregnancy in second trimester (Primary) FHR and BP appropriate today Normal 28-week labs  2. History of asthma Currently doing well  3. Alpha thalassemia silent carrier Partner kicked previously given  4. [redacted] weeks gestation of pregnancy   Preterm labor symptoms and general obstetric precautions including but not limited to vaginal bleeding, contractions, leaking of fluid and fetal movement were reviewed in detail with the patient. Please refer to After Visit Summary for other counseling recommendations.   No follow-ups on file.  Future Appointments  Date Time Provider Department Center  12/22/2024  3:15 PM [facility] PROVIDER 1 [facility]  12/22/2024  3:30 PM [facility] US2 [facility]  12/29/2024 10:55 AM Lola Donnice HERO, MD [facility]  01/12/2025  3:15 PM [facility] MOM BABY DYAD PROVIDER [facility]  01/26/2025  3:15 PM Lola Donnice HERO, MD [facility]  02/02/2025  3:15 PM Danny Geralds, DO [facility]  02/09/2025  3:15 PM [facility] MOM BABY DYAD PROVIDER [facility]  02/16/2025  3:15 PM [facility] MOM BABY DYAD PROVIDER [facility]  02/23/2025  3:15  PM [facility] MOM BABY DYAD PROVIDER [facility]    Norleen LULLA Rover, MD  "

## 2024-12-22 ENCOUNTER — Ambulatory Visit

## 2024-12-28 ENCOUNTER — Encounter

## 2024-12-29 ENCOUNTER — Encounter: Payer: Self-pay | Admitting: Family Medicine

## 2024-12-29 ENCOUNTER — Ambulatory Visit: Admitting: Family Medicine

## 2024-12-29 ENCOUNTER — Other Ambulatory Visit: Payer: Self-pay

## 2024-12-29 VITALS — BP 111/67 | HR 89 | Wt 271.2 lb

## 2024-12-29 DIAGNOSIS — J45909 Unspecified asthma, uncomplicated: Secondary | ICD-10-CM

## 2024-12-29 DIAGNOSIS — O99212 Obesity complicating pregnancy, second trimester: Secondary | ICD-10-CM

## 2024-12-29 DIAGNOSIS — O219 Vomiting of pregnancy, unspecified: Secondary | ICD-10-CM | POA: Diagnosis not present

## 2024-12-29 DIAGNOSIS — Z3A31 31 weeks gestation of pregnancy: Secondary | ICD-10-CM | POA: Diagnosis not present

## 2024-12-29 DIAGNOSIS — Z3402 Encounter for supervision of normal first pregnancy, second trimester: Secondary | ICD-10-CM

## 2024-12-29 DIAGNOSIS — Z23 Encounter for immunization: Secondary | ICD-10-CM | POA: Diagnosis not present

## 2024-12-29 DIAGNOSIS — O99512 Diseases of the respiratory system complicating pregnancy, second trimester: Secondary | ICD-10-CM

## 2024-12-29 DIAGNOSIS — O99210 Obesity complicating pregnancy, unspecified trimester: Secondary | ICD-10-CM

## 2024-12-29 MED ORDER — PROMETHAZINE HCL 25 MG PO TABS: 25.0000 mg | ORAL_TABLET | Freq: Four times a day (QID) | ORAL | 0 refills | Status: AC | PRN

## 2024-12-29 NOTE — Patient Instructions (Signed)

## 2024-12-29 NOTE — Progress Notes (Signed)
" ° °  Subjective:  Hannah Meyer is a 29 y.o. G1P0000 at [redacted]w[redacted]d being seen today for ongoing prenatal care.  She is currently monitored for the following issues for this low-risk pregnancy and has Supervision of high risk pregnancy; Asthma during pregnancy in second trimester; Alpha thalassemia silent carrier; and Obesity affecting pregnancy on their problem list.  Patient reports no complaints.  Contractions: Not present. Vag. Bleeding: None.  Movement: Present. Denies leaking of fluid.   The following portions of the patient's history were reviewed and updated as appropriate: allergies, current medications, past family history, past medical history, past social history, past surgical history and problem list. Problem list updated.  Objective:   Vitals:   12/29/24 1054  BP: 111/67  Pulse: 89  Weight: 271 lb 4 oz (123 kg)    Fetal Status: Fetal Heart Rate (bpm): 130   Movement: Present     General:  Alert, oriented and cooperative. Patient is in no acute distress.  Skin: Skin is warm and dry. No rash noted.   Cardiovascular: Normal heart rate noted  Respiratory: Normal respiratory effort, no problems with respiration noted  Abdomen: Soft, gravid, appropriate for gestational age. Pain/Pressure: Absent     Pelvic: Vag. Bleeding: None     Cervical exam deferred        Extremities: Normal range of motion.  Edema: None  Mental Status: Normal mood and affect. Normal behavior. Normal judgment and thought content.   Urinalysis:      Assessment and Plan:  Pregnancy: G1P0000 at [redacted]w[redacted]d  1. Supervision of high risk pregnancy (Primary) BP and FHR normal Up to date on labs Anatomy scan f/u scheduled for 12/31/2024 Refill sent for phenergan  per patient request TDAP given today  2. Asthma during pregnancy in second trimester No issues lately  3. Obesity affecting pregnancy, antepartum, unspecified obesity type   Preterm labor symptoms and general obstetric precautions including but not  limited to vaginal bleeding, contractions, leaking of fluid and fetal movement were reviewed in detail with the patient. Please refer to After Visit Summary for other counseling recommendations.  Return in 2 weeks (on 01/12/2025) for Dyad patient, ob visit.   Lola Donnice HERO, MD "

## 2024-12-31 ENCOUNTER — Ambulatory Visit: Attending: Maternal & Fetal Medicine

## 2024-12-31 ENCOUNTER — Ambulatory Visit: Admitting: Maternal & Fetal Medicine

## 2024-12-31 VITALS — BP 136/71 | HR 81

## 2024-12-31 DIAGNOSIS — Z3A24 24 weeks gestation of pregnancy: Secondary | ICD-10-CM | POA: Diagnosis not present

## 2024-12-31 DIAGNOSIS — Z3402 Encounter for supervision of normal first pregnancy, second trimester: Secondary | ICD-10-CM

## 2024-12-31 DIAGNOSIS — O99513 Diseases of the respiratory system complicating pregnancy, third trimester: Secondary | ICD-10-CM

## 2024-12-31 DIAGNOSIS — O99213 Obesity complicating pregnancy, third trimester: Secondary | ICD-10-CM | POA: Diagnosis not present

## 2024-12-31 DIAGNOSIS — O99210 Obesity complicating pregnancy, unspecified trimester: Secondary | ICD-10-CM

## 2024-12-31 DIAGNOSIS — Z3A31 31 weeks gestation of pregnancy: Secondary | ICD-10-CM | POA: Insufficient documentation

## 2024-12-31 DIAGNOSIS — Z148 Genetic carrier of other disease: Secondary | ICD-10-CM | POA: Insufficient documentation

## 2024-12-31 DIAGNOSIS — J45909 Unspecified asthma, uncomplicated: Secondary | ICD-10-CM | POA: Diagnosis not present

## 2024-12-31 DIAGNOSIS — O99512 Diseases of the respiratory system complicating pregnancy, second trimester: Secondary | ICD-10-CM

## 2024-12-31 NOTE — Progress Notes (Signed)
 "  Patient information  Patient Name: Hannah Meyer  Patient MRN:   [id number]  Referring practice: MFM Referring Provider: [facility])  Problem List   Patient Active Problem List   Diagnosis Date Noted   Obesity affecting pregnancy 11/09/2024   Alpha thalassemia silent carrier 08/23/2024   Asthma during pregnancy in second trimester 08/16/2024   Supervision of high risk pregnancy 08/05/2024   Maternal Fetal medicine Consult  Hannah Meyer is a 29 y.o. G1P0000 at [redacted]w[redacted]d here for ultrasound and consultation. Hannah Meyer is doing well today with no acute concerns. Today we focused on the following:   The patient returns today for a growth ultrasound due to maternal obesity, with an elevated pre-pregnancy BMI. Pregnancy is also complicated by asthma, which is currently well controlled, with no interval exacerbations or additional concerns since the prior visit.  Ultrasound today demonstrates appropriate interval growth, with an estimated fetal weight at the 31st percentile. Interval anatomy was further evaluated, and additional anatomic views were successfully cleared. No new abnormalities are identified.  We discussed the reassuring nature of todays findings. I emphasized the importance of daily fetal movement monitoring and reviewed indications to seek prompt evaluation.  The ongoing antenatal surveillance plan was reviewed in detail.  Sonographic findings Single intrauterine pregnancy at 31w 3d.  Fetal cardiac activity:  Observed and appears normal. Presentation: Cephalic. Interval fetal anatomy appears normal. Fetal biometry shows the estimated fetal weight of 3 lb 13 oz,  1720g (31%). Amniotic fluid volume: Within normal limits. AFI: 20.21cm.  MVP: 9.14 cm. Placenta: Anterior.  Recommendations -Continue routine prenatal care. -Return in 3 weeks for a non-stress test (NST). -Return in 4 weeks for a growth ultrasound with biophysical  profile (BPP). -After that visit, proceed with weekly NSTs until delivery. -Continue daily fetal movement monitoring. -Standard obstetric precautions reviewed.  The patient had time to ask questions that were answered to her satisfaction.  She verbalized understanding and agrees to proceed with the plan above.   I spent 20 minutes reviewing the patients chart, including labs and images as well as counseling the patient about her medical conditions. Greater than 50% of the time was spent in direct face-to-face patient counseling.  Delora Smaller  MFM, [place]   12/31/2024  4:15 PM   Review of Systems: A review of systems was performed and was negative except per HPI   Vitals and Physical Exam    12/31/2024    3:43 PM 12/29/2024   10:54 AM 12/14/2024    3:24 PM  Vitals with BMI  Weight  271 lbs 4 oz 264 lbs 13 oz  Systolic 136 111 887  Diastolic 71 67 75  Pulse 81 89 86    Sitting comfortably on the sonogram table Nonlabored breathing Normal rate and rhythm Abdomen is nontender  Past pregnancies OB History  Gravida Para Term Preterm AB Living  1 0 0 0 0 0  SAB IAB Ectopic Multiple Live Births  0 0 0 0     # Outcome Date GA Lbr Len/2nd Weight Sex Type Anes PTL Lv  1 Current              Future Appointments  Date Time Provider Department Center  01/12/2025  8:55 AM Lola Donnice HERO, MD [facility]  01/26/2025  3:15 PM Lola Donnice HERO, MD [facility]  02/02/2025  9:35 AM Lola Donnice HERO, MD [facility]  02/10/2025  3:55 PM Eldonna Suzen Octave, MD [facility]  02/16/2025  3:15 PM [facility] MOM BABY DYAD PROVIDER [facility]  02/23/2025  3:15 PM [facility] MOM BABY DYAD PROVIDER [facility]      "

## 2025-01-11 ENCOUNTER — Encounter

## 2025-01-12 ENCOUNTER — Encounter: Admitting: Family Medicine

## 2025-01-14 ENCOUNTER — Encounter: Payer: Self-pay | Admitting: Family Medicine

## 2025-01-14 ENCOUNTER — Encounter: Admitting: Family Medicine

## 2025-01-25 ENCOUNTER — Encounter

## 2025-01-26 ENCOUNTER — Encounter: Admitting: Family Medicine

## 2025-01-29 ENCOUNTER — Ambulatory Visit

## 2025-02-01 ENCOUNTER — Encounter

## 2025-02-02 ENCOUNTER — Encounter: Admitting: Family Medicine

## 2025-02-08 ENCOUNTER — Encounter

## 2025-02-09 ENCOUNTER — Encounter

## 2025-02-10 ENCOUNTER — Encounter: Admitting: Family Medicine

## 2025-02-15 ENCOUNTER — Encounter

## 2025-02-16 ENCOUNTER — Encounter: Admitting: Family Medicine

## 2025-02-22 ENCOUNTER — Encounter

## 2025-02-23 ENCOUNTER — Encounter: Admitting: Family Medicine
